# Patient Record
Sex: Female | Born: 1985
Health system: Southern US, Community
[De-identification: ages and names within clinical notes are randomized; demographics above are authoritative.]

## PROBLEM LIST (undated history)

## (undated) DIAGNOSIS — O24419 Gestational diabetes mellitus in pregnancy, unspecified control: Secondary | ICD-10-CM

## (undated) DIAGNOSIS — D219 Benign neoplasm of connective and other soft tissue, unspecified: Secondary | ICD-10-CM

## (undated) DIAGNOSIS — J45909 Unspecified asthma, uncomplicated: Secondary | ICD-10-CM

## (undated) DIAGNOSIS — I10 Essential (primary) hypertension: Secondary | ICD-10-CM

## (undated) DIAGNOSIS — R87629 Unspecified abnormal cytological findings in specimens from vagina: Secondary | ICD-10-CM

## (undated) DIAGNOSIS — G43909 Migraine, unspecified, not intractable, without status migrainosus: Secondary | ICD-10-CM

## (undated) HISTORY — DX: Benign neoplasm of connective and other soft tissue, unspecified: D21.9

## (undated) HISTORY — PX: WISDOM TOOTH EXTRACTION: SHX21

## (undated) HISTORY — PX: HERNIA REPAIR: SHX51

## (undated) HISTORY — DX: Essential (primary) hypertension: I10

## (undated) HISTORY — DX: Gestational diabetes mellitus in pregnancy, unspecified control: O24.419

## (undated) HISTORY — DX: Unspecified abnormal cytological findings in specimens from vagina: R87.629

---
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
## 2012-06-22 HISTORY — PX: TONSILLECTOMY: SUR1361

## 2017-11-24 ENCOUNTER — Telehealth: Payer: Self-pay | Admitting: General Practice

## 2017-11-24 DIAGNOSIS — Z3490 Encounter for supervision of normal pregnancy, unspecified, unspecified trimester: Secondary | ICD-10-CM

## 2017-11-24 NOTE — Telephone Encounter (Signed)
Eliezer Lofts from [facility] called and left message on our nurse voicemail line stating the patient came a couple weeks ago for a pregnancy test which was positive. Patient returned again last night for ultrasound. Ultrasound shows two gestational sacs but no yolk sac or fetal pole. Patient has not had bleeding or cramping. They are calling to refer the patient for a follow up ultrasound. Scheduled ultrasound for 6/10 @ 8am. Called & informed patient. Patient verbalized understanding & had no questions.

## 2017-11-29 ENCOUNTER — Ambulatory Visit (INDEPENDENT_AMBULATORY_CARE_PROVIDER_SITE_OTHER): Payer: Medicaid Other | Admitting: *Deleted

## 2017-11-29 ENCOUNTER — Ambulatory Visit (HOSPITAL_COMMUNITY)
Admission: RE | Admit: 2017-11-29 | Discharge: 2017-11-29 | Disposition: A | Discharge status: Home / Self Care | Payer: Medicaid Other | Source: Ambulatory Visit | Attending: Medical | Admitting: Medical

## 2017-11-29 DIAGNOSIS — Z3A01 Less than 8 weeks gestation of pregnancy: Secondary | ICD-10-CM | POA: Insufficient documentation

## 2017-11-29 DIAGNOSIS — Z712 Person consulting for explanation of examination or test findings: Secondary | ICD-10-CM

## 2017-11-29 DIAGNOSIS — Z3491 Encounter for supervision of normal pregnancy, unspecified, first trimester: Secondary | ICD-10-CM | POA: Insufficient documentation

## 2017-11-29 DIAGNOSIS — O3680X0 Pregnancy with inconclusive fetal viability, not applicable or unspecified: Secondary | ICD-10-CM

## 2017-11-29 NOTE — Progress Notes (Signed)
[place] results reviewed by Marcille Buffy. Pt and significant other informed of results, including low FHR. She will need follow up US in 10 days to check for progression of pregnancy. Pt was advised to return to hospital if she develops severe abdominal cramping or heavy vaginal bleeding. Pt voiced understanding of all information and instructions given.

## 2017-11-29 NOTE — Progress Notes (Signed)
I have reviewed the chart and agree with nursing staff's documentation of this patient's encounter.  Marcille Buffy, CNM 11/29/2017 12:00 PM

## 2017-12-06 ENCOUNTER — Encounter: Payer: Self-pay | Admitting: Certified Nurse Midwife

## 2017-12-09 ENCOUNTER — Other Ambulatory Visit (HOSPITAL_COMMUNITY)
Admission: AD | Admit: 2017-12-09 | Discharge: 2017-12-09 | Disposition: A | Discharge status: Home / Self Care | Payer: Medicaid Other | Source: Ambulatory Visit | Attending: Advanced Practice Midwife | Admitting: Advanced Practice Midwife

## 2017-12-09 ENCOUNTER — Encounter: Payer: Self-pay | Admitting: Advanced Practice Midwife

## 2017-12-09 ENCOUNTER — Ambulatory Visit (HOSPITAL_COMMUNITY)
Admission: RE | Admit: 2017-12-09 | Discharge: 2017-12-09 | Disposition: A | Discharge status: Home / Self Care | Payer: Medicaid Other | Source: Ambulatory Visit | Attending: Advanced Practice Midwife | Admitting: Advanced Practice Midwife

## 2017-12-09 ENCOUNTER — Telehealth: Payer: Self-pay | Admitting: Advanced Practice Midwife

## 2017-12-09 ENCOUNTER — Encounter: Payer: Medicaid Other | Admitting: Certified Nurse Midwife

## 2017-12-09 ENCOUNTER — Other Ambulatory Visit: Payer: Self-pay

## 2017-12-09 ENCOUNTER — Ambulatory Visit (INDEPENDENT_AMBULATORY_CARE_PROVIDER_SITE_OTHER): Payer: Medicaid Other | Admitting: Advanced Practice Midwife

## 2017-12-09 VITALS — BP 119/85 | HR 75 | Wt 193.0 lb

## 2017-12-09 DIAGNOSIS — O021 Missed abortion: Secondary | ICD-10-CM

## 2017-12-09 DIAGNOSIS — O3680X0 Pregnancy with inconclusive fetal viability, not applicable or unspecified: Secondary | ICD-10-CM | POA: Diagnosis not present

## 2017-12-09 LAB — CBC
HEMATOCRIT: 34.5 % — AB (ref 36.0–46.0)
HEMOGLOBIN: 11.1 g/dL — AB (ref 12.0–15.0)
MCH: 25.7 pg — AB (ref 26.0–34.0)
MCHC: 32.2 g/dL (ref 30.0–36.0)
MCV: 79.9 fL (ref 78.0–100.0)
Platelets: 281 10*3/uL (ref 150–400)
RBC: 4.32 MIL/uL (ref 3.87–5.11)
RDW: 16.5 % — ABNORMAL HIGH (ref 11.5–15.5)
WBC: 7.2 10*3/uL (ref 4.0–10.5)

## 2017-12-09 LAB — ABO/RH: ABO/RH(D): O POS

## 2017-12-09 MED ORDER — TRAMADOL HCL 50 MG PO TABS: 50.0000 mg | ORAL_TABLET | Freq: Four times a day (QID) | ORAL | 0 refills | Status: DC | PRN

## 2017-12-09 MED ORDER — MISOPROSTOL 200 MCG PO TABS: 200.0000 ug | ORAL_TABLET | Freq: Four times a day (QID) | ORAL | 0 refills | Status: DC

## 2017-12-09 MED ORDER — PROMETHAZINE HCL 25 MG PO TABS: 12.5000 mg | ORAL_TABLET | Freq: Four times a day (QID) | ORAL | 0 refills | Status: DC | PRN

## 2017-12-09 NOTE — Progress Notes (Signed)
S: 32 y.o. P/U/S presents to [facility] office for follow up results after scheduled outpatient ultrasound. She initial presented from [place] on 11/29/17 for follow up US with positive pregnancy test but only gestation sac x 2 seen on care center [place].  On 11/29/17 at [facility], she had irregularly shaped gestational sac x 1, yolk sac, and fetal pole with FHR 70 bpm at [redacted]w[redacted]d.  A follow up US was ordered in 10 days.  She had [place] today but had new OB scheduled at [facility] so went to her appointment instead of coming down to [facility] office for results.  She was sent back to [facility] from [place] for results.  She denies pain or bleeding today.  She did have some light bleeding 4 days ago that lasted 2-3 days but has resolved today.   HPI  Review of Systems  Constitutional: Negative for chills, fever and malaise/fatigue.  Eyes: Negative for blurred vision.  Respiratory: Negative for cough and shortness of breath.   Cardiovascular: Negative for chest pain.  Gastrointestinal: Negative for heartburn, nausea and vomiting.  Genitourinary: Negative for dysuria, frequency and urgency.  Musculoskeletal: Negative.   Neurological: Negative for dizziness and headaches.  Psychiatric/Behavioral: Negative for depression.    O: BP 119/85   Pulse 75   Wt 193 lb (87.5 kg)   LMP 09/26/2017  US Ob Transvaginal  Result Date: 12/09/2017 CLINICAL DATA:  Followup viability. Quantitative beta HCG is not available. Unknown LMP. By first ultrasound patient is 7 weeks 4 days. EDC by first ultrasound is 07/24/2018. EXAM: TRANSVAGINAL OB ULTRASOUND TECHNIQUE: Transvaginal ultrasound was performed for complete evaluation of the gestation as well as the maternal uterus, adnexal regions, and pelvic cul-de-sac. COMPARISON:  11/29/2017 FINDINGS: Intrauterine gestational sac: Single Yolk sac:  Visualized. Embryo:  Visualized. Cardiac Activity: Not Visualized. Heart Rate: Absent bpm CRL:   4.6 mm   6 w 1 d                  [place] EDC: 08/03/2018  Subchorionic hemorrhage:  None visualized. Maternal uterus/adnexae: Normal appearance of the ovaries. No free pelvic fluid. IMPRESSION: 1. Absent embryonic cardiac activity, consistent with embryonic demise. Heart rate was noted to be 70 beats per minute on 11/29/2017. 2. Lack of appropriate interval growth. Electronically Signed   By: Nolon Nations M.D.   On: 12/09/2017 15:36   --/--/O POS Performed at [facility], [address], [place], [place] [address]  (06/20 1733)   VS reviewed, nursing note reviewed,  Constitutional: well developed, well nourished, no distress HEENT: normocephalic CV: normal rate Pulm/chest wall: normal effort Abdomen: soft Neuro: alert and oriented x 3 Skin: warm, dry Psych: affect normal  MDM: Discussed [place] results today with pt, showing no growth in size of fetal pole in 10 days and no FHR today. This is definitive for missed ab.  Offered pt expectant management vs Cytotec vs D&C. Pt elects to take Cytotec. Rx written for Cytotec 600 mcg buccal dosing x 1.  Rx for Phenergan and Tramadol PRN sent to pharmacy as well. F/U at [facility] in 2 weeks.  Return to MAU with heavy bleeding/pain/emergencies.   Ordered CBC, Abo/Rh to be drawn outpatient since lab now closed in clinic. Labs drawn and will call pt with results. Pt stable at time of discharge.  A: 1. Missed abortion     P: D/C home Follow up in 2 weeks in office.   Fatima Blank, CNM 10:41 AM

## 2017-12-09 NOTE — Patient Instructions (Signed)

## 2017-12-10 ENCOUNTER — Telehealth: Payer: Self-pay | Admitting: Advanced Practice Midwife

## 2017-12-10 NOTE — Telephone Encounter (Signed)
Called pt to notify her of normal lab results yesterday.  Blood type O positive.  Pt took Cytotec last night and had heavy bleeding with clots last night and this morning. She is doing well, denies any dizziness, fatigue, ha, SOB. Bleeding is moderate now.  Plan for f/u 12/24/17 in [facility] office as scheduled. Return to MAU as needed for emergencies.

## 2017-12-13 NOTE — Telephone Encounter (Signed)
duplicate

## 2017-12-24 ENCOUNTER — Ambulatory Visit (INDEPENDENT_AMBULATORY_CARE_PROVIDER_SITE_OTHER): Payer: Medicaid Other | Admitting: Obstetrics and Gynecology

## 2017-12-24 ENCOUNTER — Encounter: Payer: Self-pay | Admitting: Obstetrics and Gynecology

## 2017-12-24 VITALS — BP 143/90 | HR 74 | Wt 189.7 lb

## 2017-12-24 DIAGNOSIS — O039 Complete or unspecified spontaneous abortion without complication: Secondary | ICD-10-CM

## 2017-12-24 NOTE — Progress Notes (Signed)
S:  Kristine Johnson is a 32 y.o. G1 here in the office for a follow up from a recent SAB with cytotec. She took cytotec 600 mg buccal on 6/20. She started having heavy bleeding a few hours after taking the cyotec. Says she knows she passed tissue. Her bleeding has stopped. She denies pain at this time.    O:  GENERAL: Well-developed, well-nourished female in no acute distress.  LUNGS: Effort normal SKIN: Warm, dry and without erythema PSYCH: Normal mood and affect  Vitals:   12/24/17 1604  Weight: 189 lb 11.2 oz (86 kg)   Blood pressure (!) 143/90, pulse 74, weight 189 lb 11.2 oz (86 kg).   A:  SAB  P:  Doing well Quant today Follow up in 2 weeks   Presley Summerlin, Artist Pais, NP 12/24/2017 4:54 PM

## 2017-12-25 LAB — BETA HCG QUANT (REF LAB): hCG Quant: 19 m[IU]/mL

## 2017-12-31 ENCOUNTER — Telehealth: Payer: Self-pay | Admitting: General Practice

## 2017-12-31 NOTE — Telephone Encounter (Signed)
Patient called and left message on nurse voicemail line requesting results. Called patient & informed her of results. Discussed a repeat level may be ordered at her next visit if the provider feels it is needed. Patient verbalized understanding & had no questions.

## 2018-01-12 ENCOUNTER — Other Ambulatory Visit: Payer: Medicaid Other

## 2018-01-12 ENCOUNTER — Ambulatory Visit: Payer: Medicaid Other | Admitting: Student

## 2018-01-12 DIAGNOSIS — O039 Complete or unspecified spontaneous abortion without complication: Secondary | ICD-10-CM

## 2018-01-12 NOTE — Addendum Note (Signed)
Addended by: Diona Foley on: 01/12/2018 04:09 PM   Modules accepted: Orders

## 2018-01-13 LAB — BETA HCG QUANT (REF LAB): hCG Quant: <1 m[IU]/mL

## 2018-06-25 ENCOUNTER — Ambulatory Visit: Payer: Medicaid Other | Admitting: Nurse Practitioner

## 2018-06-25 ENCOUNTER — Encounter: Payer: Self-pay | Admitting: Nurse Practitioner

## 2018-06-25 VITALS — BP 130/80 | HR 83 | Temp 98.8°F | Resp 14 | Wt 201.6 lb

## 2018-06-25 DIAGNOSIS — J309 Allergic rhinitis, unspecified: Secondary | ICD-10-CM

## 2018-06-25 MED ORDER — FLUTICASONE PROPIONATE 50 MCG/ACT NA SUSP: 2.0000 | Freq: Every day | NASAL | 0 refills | Status: DC

## 2018-06-25 MED ORDER — MONTELUKAST SODIUM 10 MG PO TABS: 10.0000 mg | ORAL_TABLET | Freq: Every day | ORAL | 0 refills | Status: DC

## 2018-06-25 MED ORDER — BENZONATATE 200 MG PO CAPS: 200.0000 mg | ORAL_CAPSULE | Freq: Three times a day (TID) | ORAL | 0 refills | Status: AC | PRN

## 2018-06-25 NOTE — Progress Notes (Signed)
Subjective:     Kristine Johnson is a 33 y.o. female who presents for evaluation of sore throat. Associated symptoms include left ear pressure, nasal blockage, post nasal drip, sinus and nasal congestion and sore throat. Onset of symptoms was several days ago with coughing and nasal congestion, but sore throat and ear pain started one day ago. and has been unchanged since that time. She is drinking plenty of fluids. She has not had a recent close exposure to someone with proven streptococcal pharyngitis.    "Patient also complains of a cough that she has had for several days now.  Patient states the cough is productive of yellow sputum.  Patient has been drinking tea and honey for the cough, and has taken ibuprofen for her ear pain.  Patient rates current throat pain 4/10 at present and her ear pain she states is controlled but can get as high as 10/10.  Patient states she does have a history of seasonal allergies.  The following portions of the patient's history were reviewed and updated as appropriate: allergies, current medications and past medical history.  Review of Systems Constitutional: negative Eyes: negative Ears, nose, mouth, throat, and face: positive for earaches, nasal congestion and sore throat, negative for ear drainage and hoarseness Respiratory: positive for cough and sputum, negative for asthma, chronic bronchitis, dyspnea on exertion, stridor and wheezing Cardiovascular: negative Gastrointestinal: negative Neurological: positive for headaches, negative for coordination problems, dizziness, paresthesia, vertigo and weakness    Objective:    BP 130/80   Pulse 83   Temp 98.8 F (37.1 C)   Resp 14   Wt 201 lb 9.6 oz (91.4 kg)   SpO2 99%  General appearance: alert, cooperative and no distress Head: Normocephalic, without obvious abnormality, atraumatic Eyes: conjunctivae/corneas clear. PERRL, EOM's intact. Fundi benign. Ears: normal TM's and external ear canals both  ears Nose: no discharge, mild congestion, turbinates swollen, inflamed, no sinus tenderness Throat: abnormal findings: mild oropharyngeal erythema Lungs: clear to auscultation bilaterally Heart: regular rate and rhythm, S1, S2 normal, no murmur, click, rub or gallop Abdomen: soft, non-tender; bowel sounds normal; no masses,  no organomegaly Pulses: 2+ and symmetric Skin: Skin color, texture, turgor normal. No rashes or lesions Lymph nodes: cervical and submandibular nodes normal Neurologic: Grossly normal, no cranial nerve deficits  Laboratory Strep test not done. Results:n/a.    Assessment:   Allergic Rhinitis Plan:   Exam findings, diagnosis etiology and medication use and indications reviewed with patient. Follow- Up and discharge instructions provided. No emergent/urgent issues found on exam.  Some the patient's clinical presentation, symptoms, physical assessment, I do not feel this is an infectious process at this time.  No antibiotics are warranted at this time however will provide symptomatic treatment for the patient.  Informed patient that if symptoms do not improve within the next 10 to 14 days to return to our office, or follow-up sooner if there are any changes or worsening of current symptoms.  Patient education was provided. Patient verbalized understanding of information provided and agrees with plan of care (POC), all questions answered. The patient is advised to call or return to clinic if condition does not see an improvement in symptoms, or to seek the care of the closest emergency department if condition worsens with the above plan.   1. Allergic rhinitis, unspecified seasonality, unspecified trigger  - montelukast (SINGULAIR) 10 MG tablet; Take 1 tablet (10 mg total) by mouth at bedtime.  Dispense: 30 tablet; Refill: 0 - fluticasone (FLONASE) 50  MCG/ACT nasal spray; Place 2 sprays into both nostrils daily for 10 days.  Dispense: 16 g; Refill: 0 - benzonatate (TESSALON)  200 MG capsule; Take 1 capsule (200 mg total) by mouth 3 (three) times daily as needed for up to 10 days for cough.  Dispense: 30 capsule; Refill: 0  -Take medication as prescribed. -Ibuprofen or Tylenol for pain, fever, or general discomfort. -Increase fluids. -Sleep elevated on at least 2 pillows at bedtime to help with cough. -Use a humidifier or vaporizer when at home and during sleep to help with cough. -May use a teaspoon of honey or over-the-counter cough drops to help with cough. -Follow-up in 10-14 days if symptoms do not improve.

## 2018-06-25 NOTE — Patient Instructions (Signed)
Allergic Rhinitis, Adult -Take medication as prescribed. -Ibuprofen or Tylenol for pain, fever, or general discomfort. -Increase fluids. -Sleep elevated on at least 2 pillows at bedtime to help with cough. -Use a humidifier or vaporizer when at home and during sleep to help with cough. -May use a teaspoon of honey or over-the-counter cough drops to help with cough. -Follow-up in 10-14 days if symptoms do not improve.  Allergic rhinitis is an allergic reaction that affects the mucous membrane inside the nose. It causes sneezing, a runny or stuffy nose, and the feeling of mucus going down the back of the throat (postnasal drip). Allergic rhinitis can be mild to severe. There are two types of allergic rhinitis:  Seasonal. This type is also called hay fever. It happens only during certain seasons.  Perennial. This type can happen at any time of the year. What are the causes? This condition happens when the body's defense system (immune system) responds to certain harmless substances called allergens as though they were germs.  Seasonal allergic rhinitis is triggered by pollen, which can come from grasses, trees, and weeds. Perennial allergic rhinitis may be caused by:  House dust mites.  Pet dander.  Mold spores. What are the signs or symptoms? Symptoms of this condition include:  Sneezing.  Runny or stuffy nose (nasal congestion).  Postnasal drip.  Itchy nose.  Tearing of the eyes.  Trouble sleeping.  Daytime sleepiness. How is this diagnosed? This condition may be diagnosed based on:  Your medical history.  A physical exam.  Tests to check for related conditions, such as: ? Asthma. ? Pink eye. ? Ear infection. ? Upper respiratory infection.  Tests to find out which allergens trigger your symptoms. These may include skin or blood tests. How is this treated? There is no cure for this condition, but treatment can help control symptoms. Treatment may  include:  Taking medicines that block allergy symptoms, such as antihistamines. Medicine may be given as a shot, nasal spray, or pill.  Avoiding the allergen.  Desensitization. This treatment involves getting ongoing shots until your body becomes less sensitive to the allergen. This treatment may be done if other treatments do not help.  If taking medicine and avoiding the allergen does not work, new, stronger medicines may be prescribed. Follow these instructions at home:  Find out what you are allergic to. Common allergens include smoke, dust, and pollen.  Avoid the things you are allergic to. These are some things you can do to help avoid allergens: ? Replace carpet with wood, tile, or vinyl flooring. Carpet can trap dander and dust. ? Do not smoke. Do not allow smoking in your home. ? Change your heating and air conditioning filter at least once a month. ? During allergy season:  Keep windows closed as much as possible.  Plan outdoor activities when pollen counts are lowest. This is usually during the evening hours.  When coming indoors, change clothing and shower before sitting on furniture or bedding.  Take over-the-counter and prescription medicines only as told by your health care provider.  Keep all follow-up visits as told by your health care provider. This is important. Contact a health care provider if:  You have a fever.  You develop a persistent cough.  You make whistling sounds when you breathe (you wheeze).  Your symptoms interfere with your normal daily activities. Get help right away if:  You have shortness of breath. Summary  This condition can be managed by taking medicines as directed and  avoiding allergens.  Contact your health care provider if you develop a persistent cough or fever.  During allergy season, keep windows closed as much as possible. This information is not intended to replace advice given to you by your health care provider. Make  sure you discuss any questions you have with your health care provider. Document Released: 03/03/2001 Document Revised: 07/16/2016 Document Reviewed: 07/16/2016 Elsevier Interactive Patient Education  2019 [employer].

## 2018-08-05 ENCOUNTER — Ambulatory Visit: Payer: Medicaid Other | Admitting: Nurse Practitioner

## 2018-08-05 VITALS — BP 110/75 | HR 84 | Temp 98.6°F | Resp 18 | Wt 206.8 lb

## 2018-08-05 DIAGNOSIS — J069 Acute upper respiratory infection, unspecified: Secondary | ICD-10-CM

## 2018-08-05 DIAGNOSIS — R6889 Other general symptoms and signs: Secondary | ICD-10-CM

## 2018-08-05 LAB — POCT INFLUENZA A/B
Influenza A, POC: NEGATIVE
Influenza B, POC: NEGATIVE

## 2018-08-05 MED ORDER — PSEUDOEPH-BROMPHEN-DM 30-2-10 MG/5ML PO SYRP: 5.0000 mL | ORAL_SOLUTION | Freq: Four times a day (QID) | ORAL | 0 refills | Status: AC | PRN

## 2018-08-05 MED ORDER — FLUTICASONE PROPIONATE 50 MCG/ACT NA SUSP: 2.0000 | Freq: Every day | NASAL | 0 refills | Status: DC

## 2018-08-05 MED ORDER — LIDOCAINE VISCOUS HCL 2 % MT SOLN: 5.0000 mL | OROMUCOSAL | 0 refills | Status: AC | PRN

## 2018-08-05 NOTE — Patient Instructions (Signed)
Upper Respiratory Infection, Adult -Take medication as prescribed. -Ibuprofen 800mg  every 8 hours for the next 48 hours.  This will help with throat pain/inflammation, generalized bodyaches and discomfort. -Get plenty of rest. -Increase fluids. -Sleep elevated on at least 2 pillows at bedtime to help with cough. -Use a humidifier or vaporizer when at home and during sleep to help with cough. -May use a teaspoon of honey or over-the-counter cough drops to help with cough and sore throat. -Follow-up if symptoms do not improve within the next 5-7 days or worsen before that time.  An upper respiratory infection (URI) is a common viral infection of the nose, throat, and upper air passages that lead to the lungs. The most common type of URI is the common cold. URIs usually get better on their own, without medical treatment. What are the causes? A URI is caused by a virus. You may catch a virus by:  Breathing in droplets from an infected person's cough or sneeze.  Touching something that has been exposed to the virus (contaminated) and then touching your mouth, nose, or eyes. What increases the risk? You are more likely to get a URI if:  You are very young or very old.  It is autumn or winter.  You have close contact with others, such as at a daycare, school, or health care facility.  You smoke.  You have long-term (chronic) heart or lung disease.  You have a weakened disease-fighting (immune) system.  You have nasal allergies or asthma.  You are experiencing a lot of stress.  You work in an area that has poor air circulation.  You have poor nutrition. What are the signs or symptoms? A URI usually involves some of the following symptoms:  Runny or stuffy (congested) nose.  Sneezing.  Cough.  Sore throat.  Headache.  Fatigue.  Fever.  Loss of appetite.  Pain in your forehead, behind your eyes, and over your cheekbones (sinus pain).  Muscle aches.  Redness or  irritation of the eyes.  Pressure in the ears or face. How is this diagnosed? This condition may be diagnosed based on your medical history and symptoms, and a physical exam. Your health care provider may use a cotton swab to take a mucus sample from your nose (nasal swab). This sample can be tested to determine what virus is causing the illness. How is this treated? URIs usually get better on their own within 7-10 days. You can take steps at home to relieve your symptoms. Medicines cannot cure URIs, but your health care provider may recommend certain medicines to help relieve symptoms, such as:  Over-the-counter cold medicines.  Cough suppressants. Coughing is a type of defense against infection that helps to clear the respiratory system, so take these medicines only as recommended by your health care provider.  Fever-reducing medicines. Follow these instructions at home: Activity  Rest as needed.  If you have a fever, stay home from work or school until your fever is gone or until your health care provider says you are no longer contagious. Your health care provider may have you wear a face mask to prevent your infection from spreading. Relieving symptoms  Gargle with a salt-water mixture 3-4 times a day or as needed. To make a salt-water mixture, completely dissolve -1 tsp of salt in 1 cup of warm water.  Use a cool-mist humidifier to add moisture to the air. This can help you breathe more easily. Eating and drinking   Drink enough fluid to keep  your urine pale yellow.  Eat soups and other clear broths. General instructions   Take over-the-counter and prescription medicines only as told by your health care provider. These include cold medicines, fever reducers, and cough suppressants.  Do not use any products that contain nicotine or tobacco, such as cigarettes and e-cigarettes. If you need help quitting, ask your health care provider.  Stay away from secondhand  smoke.  Stay up to date on all immunizations, including the yearly (annual) flu vaccine.  Keep all follow-up visits as told by your health care provider. This is important. How to prevent the spread of infection to others   URIs can be passed from person to person (are contagious). To prevent the infection from spreading: ? Wash your hands often with soap and water. If soap and water are not available, use hand sanitizer. ? Avoid touching your mouth, face, eyes, or nose. ? Cough or sneeze into a tissue or your sleeve or elbow instead of into your hand or into the air. Contact a health care provider if:  You are getting worse instead of better.  You have a fever or chills.  Your mucus is brown or red.  You have yellow or brown discharge coming from your nose.  You have pain in your face, especially when you bend forward.  You have swollen neck glands.  You have pain while swallowing.  You have white areas in the back of your throat. Get help right away if:  You have shortness of breath that gets worse.  You have severe or persistent: ? Headache. ? Ear pain. ? Sinus pain. ? Chest pain.  You have chronic lung disease along with any of the following: ? Wheezing. ? Prolonged cough. ? Coughing up blood. ? A change in your usual mucus.  You have a stiff neck.  You have changes in your: ? Vision. ? Hearing. ? Thinking. ? Mood. Summary  An upper respiratory infection (URI) is a common infection of the nose, throat, and upper air passages that lead to the lungs.  A URI is caused by a virus.  URIs usually get better on their own within 7-10 days.  Medicines cannot cure URIs, but your health care provider may recommend certain medicines to help relieve symptoms. This information is not intended to replace advice given to you by your health care provider. Make sure you discuss any questions you have with your health care provider. Document Released: 12/02/2000  Document Revised: 01/22/2017 Document Reviewed: 01/22/2017 Elsevier Interactive Patient Education  2019 [employer].

## 2018-08-05 NOTE — Progress Notes (Signed)
Subjective:    Patient ID: Kristine Johnson, female    DOB: 06-03-1986, 33 y.o.   MRN: [id number]  Patient is a 33 year old female who presents today for complaints of sore throat, headache, chest congestion, body aches, and left ear pain.  Patient also complains of chills that she had at work last night.  Patient denies fever, sinus pain or pressure, ear drainage, productive cough, abdominal pain, nausea, or vomiting.  Patient states that she took 1 Advil Cold and Sinus with no relief.  Patient also informs that she did not have an influenza vaccine this flu season.  Sore Throat   This is a new problem. The current episode started in the past 7 days. The problem has been unchanged. Neither side of throat is experiencing more pain than the other. There has been no fever. The pain is at a severity of 6/10. Associated symptoms include congestion, coughing, ear pain, headaches and trouble swallowing. Pertinent negatives include no abdominal pain, diarrhea, ear discharge, shortness of breath, stridor or vomiting. She has had no exposure to strep. Treatments tried: Took 1 Advil Cold and Sinus. The treatment provided no relief.  Headache   The current episode started in the past 7 days. The problem occurs intermittently. The problem has been gradually improving. The pain is located in the frontal region. The pain does not radiate. The pain quality is similar to prior headaches. The quality of the pain is described as aching. The pain is mild. Associated symptoms include coughing, ear pain, rhinorrhea and a sore throat. Pertinent negatives include no abdominal pain, dizziness, fever, nausea, numbness, sinus pressure, vomiting or weakness. The symptoms are aggravated by coughing. Treatments tried: Advil Cold and Sinus. The treatment provided no relief. There is no history of migraine headaches, recent head traumas or sinus disease.   No past medical history on file.   Review of Systems  Constitutional:  Positive for appetite change, chills and fatigue. Negative for fever.  HENT: Positive for congestion, ear pain, postnasal drip, rhinorrhea, sore throat and trouble swallowing. Negative for ear discharge, sinus pressure, sinus pain and voice change.   Eyes: Negative.   Respiratory: Positive for cough. Negative for chest tightness, shortness of breath, wheezing and stridor.   Cardiovascular: Negative.   Gastrointestinal: Negative for abdominal pain, diarrhea, nausea and vomiting.       Decreased appetite  Skin: Negative.   Neurological: Positive for headaches. Negative for dizziness, facial asymmetry, speech difficulty, weakness, light-headedness and numbness.      Objective: Blood pressure 110/75, pulse 84, temperature 98.6 F (37 C), temperature source Oral, resp. rate 18, weight 206 lb 12.8 oz (93.8 kg), SpO2 98 %.   Physical Exam Vitals signs reviewed.  Constitutional:      General: She is not in acute distress. HENT:     Head: Normocephalic.     Right Ear: Tympanic membrane and ear canal normal. No middle ear effusion. Tympanic membrane is not erythematous.     Left Ear: Tympanic membrane and ear canal normal.  No middle ear effusion. Tympanic membrane is not erythematous.     Nose: Congestion (moderate) and rhinorrhea present.     Mouth/Throat:     Mouth: Mucous membranes are moist.     Pharynx: Pharyngeal swelling, posterior oropharyngeal erythema and uvula swelling present. No oropharyngeal exudate.     Tonsils: No tonsillar exudate. Swelling: 1+ on the right. 1+ on the left.  Eyes:     Conjunctiva/sclera: Conjunctivae normal.     Pupils: Pupils  are equal, round, and reactive to light.  Neck:     Musculoskeletal: Normal range of motion and neck supple.     Thyroid: No thyromegaly.  Cardiovascular:     Rate and Rhythm: Normal rate and regular rhythm.     Heart sounds: Normal heart sounds.  Pulmonary:     Effort: Pulmonary effort is normal. No respiratory distress.      Breath sounds: Normal breath sounds. No stridor. No wheezing, rhonchi or rales.  Abdominal:     General: Bowel sounds are normal.     Palpations: Abdomen is soft.     Tenderness: There is no abdominal tenderness.  Lymphadenopathy:     Cervical: Cervical adenopathy (bilateral superficial) present.  Skin:    General: Skin is warm and dry.     Capillary Refill: Capillary refill takes less than 2 seconds.  Neurological:     General: No focal deficit present.     Mental Status: She is alert and oriented to person, place, and time.  Psychiatric:        Mood and Affect: Mood normal.        Behavior: Behavior normal.    Results for orders placed or performed in visit on 08/05/18 (from the past 24 hour(s))  POCT Influenza A/B     Status: Normal   Collection Time: 08/05/18 10:33 AM  Result Value Ref Range   Influenza A, POC Negative Negative   Influenza B, POC Negative Negative       Assessment & Plan:   Exam findings, diagnosis etiology and medication use and indications reviewed with patient. Follow- Up and discharge instructions provided. No emergent/urgent issues found on exam.  Based on the patient's clinical presentation, symptoms, physical exam, and influenza test result, patient symptoms are of viral etiology.  Patient does not have influenza A or B, thus, I feel her symptoms are related to an upper respiratory infection.  Patient also has not had high fever, and appears overall in no acute distress.  Patient does not have purulent nasal drainage, double sickening or a high fever to indicate a bacterial infection.  Patient's vital signs are stable in our office.  Will provide the patient with symptomatic treatment to include Bromfed for cough, fluticasone for nasal congestion, and lidocaine for her throat pain.  Instructed the patient that if her symptoms do not improve within the next 5 to 7 days, I would like her to follow-up in our office as she may need reevaluation for consideration  of antibiotics at that time.  Patient education was provided. Patient verbalized understanding of information provided and agrees with plan of care (POC), all questions answered. The patient is advised to call or return to clinic if condition does not see an improvement in symptoms, or to seek the care of the closest emergency department if condition worsens with the above plan.   1. Flu-like symptoms  - POCT Influenza A/B  2. Viral upper respiratory infection  - brompheniramine-pseudoephedrine-DM 30-2-10 MG/5ML syrup; Take 5 mLs by mouth 4 (four) times daily as needed for up to 7 days.  Dispense: 150 mL; Refill: 0 - fluticasone (FLONASE) 50 MCG/ACT nasal spray; Place 2 sprays into both nostrils daily for 10 days.  Dispense: 16 g; Refill: 0 - lidocaine (XYLOCAINE) 2 % solution; Use as directed 5 mLs in the mouth or throat as needed for up to 5 days for mouth pain. Gargle and swallow up to 4 times daily as needed.  Dispense: 100 mL; Refill: 0 -  Take medication as prescribed. -Ibuprofen 800mg  every 8 hours for the next 48 hours.  This will help with throat pain/inflammation, generalized bodyaches and discomfort. -Get plenty of rest. -Increase fluids. -Sleep elevated on at least 2 pillows at bedtime to help with cough. -Use a humidifier or vaporizer when at home and during sleep to help with cough. -May use a teaspoon of honey or over-the-counter cough drops to help with cough and sore throat. -Follow-up if symptoms do not improve within the next 5-7 days or worsen before that time.

## 2018-09-21 ENCOUNTER — Ambulatory Visit: Payer: Medicaid Other | Admitting: Physician Assistant

## 2018-09-21 ENCOUNTER — Other Ambulatory Visit: Payer: Self-pay

## 2018-09-21 VITALS — BP 110/78 | HR 77 | Temp 98.4°F | Resp 16 | Wt 204.4 lb

## 2018-09-21 DIAGNOSIS — R3915 Urgency of urination: Secondary | ICD-10-CM

## 2018-09-21 DIAGNOSIS — R82998 Other abnormal findings in urine: Secondary | ICD-10-CM

## 2018-09-21 LAB — POCT URINALYSIS DIPSTICK
Bilirubin, UA: NEGATIVE
Glucose, UA: NEGATIVE
Ketones, UA: NEGATIVE
Nitrite, UA: NEGATIVE
Protein, UA: NEGATIVE
Spec Grav, UA: 1.02 (ref 1.010–1.025)
Urobilinogen, UA: 0.2 E.U./dL
pH, UA: 7 (ref 5.0–8.0)

## 2018-09-21 LAB — POCT URINE PREGNANCY: Preg Test, Ur: NEGATIVE

## 2018-09-21 MED ORDER — NITROFURANTOIN MONOHYD MACRO 100 MG PO CAPS: 100.0000 mg | ORAL_CAPSULE | Freq: Two times a day (BID) | ORAL | 0 refills | Status: AC

## 2018-09-21 NOTE — Progress Notes (Signed)
09/21/2018 at 11:10 AM  Kristine Johnson / DOB: 09-07-1985 / MRN: [id number]  The patient does not have a problem list on file.  SUBJECTIVE  Kristine Johnson is a 33 y.o. female who complains of urinary frequency, urinary urgency, cloudy malordorous urine and suprapubic pressure x 5 days. Has some low back pain but that is normal for her has she does a lot of lifting and has issues with sciatica.  She denies dysuria, hematuria, flank pain, pelvic pain, genital rash, genital irritation and vaginal discharge. Has not tried anything for relief. Most recent UTI prior to this was years ago. Sexually active with monogamous partner. No concern for STD or pregnancy. LMP 09/10/18. Denies smoking. No PMH of HTN, DM, renal disease, or heart disease.  She  has no past medical history on file.    Medications reviewed and updated by myself where necessary, and exist elsewhere in the encounter.   Ms. Divito has No Known Allergies. She  reports that she has never smoked. She has never used smokeless tobacco. She  has no history on file for sexual activity. The patient  has no past surgical history on file.  Her family history is not on file.  Review of Systems  Constitutional: Negative for diaphoresis.  Gastrointestinal: Negative for nausea and vomiting.  Musculoskeletal: Negative for myalgias.  Skin: Negative for rash.    OBJECTIVE  Her  weight is 204 lb 6.4 oz (92.7 kg). Her oral temperature is 98.4 F (36.9 C). Her blood pressure is 110/78 and her pulse is 77. Her respiration is 16 and oxygen saturation is 99%.  The patient's body mass index is unknown because there is no height or weight on file.  Physical Exam Vitals signs reviewed.  Constitutional:      General: She is not in acute distress.    Appearance: Normal appearance. She is well-developed. She is not ill-appearing or toxic-appearing.  HENT:     Head: Normocephalic and atraumatic.  Eyes:     Conjunctiva/sclera: Conjunctivae normal.   Neck:     Musculoskeletal: Normal range of motion.  Pulmonary:     Effort: Pulmonary effort is normal.  Abdominal:     Palpations: Abdomen is soft.     Tenderness: There is no abdominal tenderness. There is no right CVA tenderness or left CVA tenderness.  Musculoskeletal:     Thoracic back: Normal. She exhibits no tenderness and no bony tenderness.     Lumbar back: Normal. She exhibits no tenderness and no bony tenderness.  Skin:    General: Skin is warm and dry.  Neurological:     Mental Status: She is alert and oriented to person, place, and time.     Results for orders placed or performed in visit on 09/21/18 (from the past 24 hour(s))  POCT urinalysis dipstick     Status: Abnormal   Collection Time: 09/21/18 10:46 AM  Result Value Ref Range   Color, UA yellow    Clarity, UA clear    Glucose, UA Negative Negative   Bilirubin, UA negative    Ketones, UA negative    Spec Grav, UA 1.020 1.010 - 1.025   Blood, UA trace    pH, UA 7.0 5.0 - 8.0   Protein, UA Negative Negative   Urobilinogen, UA 0.2 0.2 or 1.0 E.U./dL   Nitrite, UA negative    Leukocytes, UA Trace (A) Negative   Appearance     Odor    POCT urine pregnancy     Status:  Normal   Collection Time: 09/21/18 10:48 AM  Result Value Ref Range   Preg Test, Ur Negative Negative    ASSESSMENT & PLAN  Kristine was seen today for dysuria and back pain.  Diagnoses and all orders for this visit:  Urinary urgency -     POCT urinalysis dipstick -     POCT urine pregnancy  Leukocytes in urine -     nitrofurantoin, macrocrystal-monohydrate, (MACROBID) 100 MG capsule; Take 1 capsule (100 mg total) by mouth 2 (two) times daily for 5 days.    Hx and UA uggestive of UTI. UA dip w/ +leukocytes and trace blood. Will treat empirically at this time w/ oral abx. No concern for pyleonephritis-she is afebrile, no nausea, vomiting, or flank pain, no CVA tenderness noted on exam. Informed patient that we do not obtain urine  cultures in our office. If no improvement in 24-48 hrs will need to f/u with family doctor or urgent care for further evaluation and management. Advised to seek care sooner at ED if any of her current symptoms worsen or she develops new concerning symptoms with current treatment plan. Pt voices understanding.    Tenna Delaine, PA-C  [facility] 09/21/2018 11:10 AM

## 2018-09-21 NOTE — Patient Instructions (Addendum)
Urinary Tract Infection, Adult   Your results indicate you have a UTI. I have given you a prescription for an antibiotic. Please take with food. We do not obtain urine cultures at our office; therefore, if your symptoms worsen over the next 24-48 hours or you develop fever, chills, flank pain, nausea and vomiting, please seek care immediately.     A urinary tract infection (UTI) is an infection of any part of the urinary tract. The urinary tract includes:  The kidneys.  The ureters.  The bladder.  The urethra. These organs make, store, and get rid of pee (urine) in the body. What are the causes? This is caused by germs (bacteria) in your genital area. These germs grow and cause swelling (inflammation) of your urinary tract. What increases the risk? You are more likely to develop this condition if:  You have a small, thin tube (catheter) to drain pee.  You cannot control when you pee or poop (incontinence).  You are female, and: ? You use these methods to prevent pregnancy: ? A medicine that kills sperm (spermicide). ? A device that blocks sperm (diaphragm). ? You have low levels of a female hormone (estrogen). ? You are pregnant.  You have genes that add to your risk.  You are sexually active.  You take antibiotic medicines.  You have trouble peeing because of: ? A prostate that is bigger than normal, if you are female. ? A blockage in the part of your body that drains pee from the bladder (urethra). ? A kidney stone. ? A nerve condition that affects your bladder (neurogenic bladder). ? Not getting enough to drink. ? Not peeing often enough.  You have other conditions, such as: ? Diabetes. ? A weak disease-fighting system (immune system). ? Sickle cell disease. ? Gout. ? Injury of the spine. What are the signs or symptoms? Symptoms of this condition include:  Needing to pee right away (urgently).  Peeing often.  Peeing small amounts often.  Pain or burning  when peeing.  Blood in the pee.  Pee that smells bad or not like normal.  Trouble peeing.  Pee that is cloudy.  Fluid coming from the vagina, if you are female.  Pain in the belly or lower back. Other symptoms include:  Throwing up (vomiting).  No urge to eat.  Feeling mixed up (confused).  Being tired and grouchy (irritable).  A fever.  Watery poop (diarrhea). How is this treated? This condition may be treated with:  Antibiotic medicine.  Other medicines.  Drinking enough water. Follow these instructions at home:  Medicines  Take over-the-counter and prescription medicines only as told by your doctor.  If you were prescribed an antibiotic medicine, take it as told by your doctor. Do not stop taking it even if you start to feel better. General instructions  Make sure you: ? Pee until your bladder is empty. ? Do not hold pee for a long time. ? Empty your bladder after sex. ? Wipe from front to back after pooping if you are a female. Use each tissue one time when you wipe.  Drink enough fluid to keep your pee pale yellow.  Keep all follow-up visits as told by your doctor. This is important. Contact a doctor if:  You do not get better after 1-2 days.  Your symptoms go away and then come back. Get help right away if:  You have very bad back pain.  You have very bad pain in your lower belly.  You  have a fever.  You are sick to your stomach (nauseous).  You are throwing up. Summary  A urinary tract infection (UTI) is an infection of any part of the urinary tract.  This condition is caused by germs in your genital area.  There are many risk factors for a UTI. These include having a small, thin tube to drain pee and not being able to control when you pee or poop.  Treatment includes antibiotic medicines for germs.  Drink enough fluid to keep your pee pale yellow. This information is not intended to replace advice given to you by your health care  provider. Make sure you discuss any questions you have with your health care provider. Document Released: 11/25/2007 Document Revised: 12/16/2017 Document Reviewed: 12/16/2017 Elsevier Interactive Patient Education  2019 [employer].

## 2019-03-22 ENCOUNTER — Telehealth: Payer: Self-pay | Admitting: Obstetrics and Gynecology

## 2019-03-22 NOTE — Telephone Encounter (Signed)
Attempted to call patient about her appointment on 10/1 @ 10:35. No answer left voicemail instructing patient to wear a face mask for the entire appointment and no visitors are allowed during the visit. Patient instructed not to attend the appointment if she was any symptoms. Symptom list and office number left.

## 2019-03-23 ENCOUNTER — Other Ambulatory Visit: Payer: Self-pay

## 2019-03-23 ENCOUNTER — Ambulatory Visit: Payer: Medicaid Other | Admitting: Obstetrics and Gynecology

## 2019-03-23 ENCOUNTER — Encounter: Payer: Self-pay | Admitting: Obstetrics and Gynecology

## 2019-03-23 ENCOUNTER — Other Ambulatory Visit (HOSPITAL_COMMUNITY)
Admission: RE | Admit: 2019-03-23 | Discharge: 2019-03-23 | Disposition: A | Discharge status: Home / Self Care | Payer: Medicaid Other | Source: Ambulatory Visit | Attending: Obstetrics and Gynecology | Admitting: Obstetrics and Gynecology

## 2019-03-23 VITALS — BP 131/87 | HR 73 | Wt 209.0 lb

## 2019-03-23 DIAGNOSIS — Z01419 Encounter for gynecological examination (general) (routine) without abnormal findings: Secondary | ICD-10-CM

## 2019-03-23 DIAGNOSIS — Z23 Encounter for immunization: Secondary | ICD-10-CM

## 2019-03-23 DIAGNOSIS — Z0000 Encounter for general adult medical examination without abnormal findings: Secondary | ICD-10-CM | POA: Diagnosis not present

## 2019-03-23 DIAGNOSIS — N946 Dysmenorrhea, unspecified: Secondary | ICD-10-CM

## 2019-03-23 MED ORDER — ONDANSETRON 8 MG PO TBDP: 8.0000 mg | ORAL_TABLET | Freq: Three times a day (TID) | ORAL | 1 refills | Status: DC | PRN

## 2019-03-23 MED ORDER — TRAMADOL HCL 50 MG PO TABS: 50.0000 mg | ORAL_TABLET | Freq: Four times a day (QID) | ORAL | 0 refills | Status: DC | PRN

## 2019-03-23 MED ORDER — IBUPROFEN 800 MG PO TABS: 800.0000 mg | ORAL_TABLET | Freq: Four times a day (QID) | ORAL | 2 refills | Status: DC | PRN

## 2019-03-23 NOTE — Progress Notes (Signed)
Questions about cycles. Needs pap. Breast exam. STD Checks.

## 2019-03-23 NOTE — Progress Notes (Signed)
GYNECOLOGY ANNUAL PREVENTATIVE CARE ENCOUNTER NOTE  History:     Kristine Johnson is a 33 y.o. No obstetric history on file. female here for a routine annual gynecologic exam.  Current complaints: new onset painful menstrual cycles. Cycles are heavier and more painful since an SAB she had.    Denies abnormal vaginal bleeding, discharge, pelvic pain, problems with intercourse or other gynecologic concerns.    Gynecologic History No LMP recorded. Contraception: none- desires pregnancy  Last Pap: 2019. Results were: normal with negative HPV Last mammogram: NA  Obstetric History OB History  No obstetric history on file.    No past medical history on file.    Current Outpatient Medications on File Prior to Visit  Medication Sig Dispense Refill  . Multiple Vitamin (MULTIVITAMIN) tablet Take 1 tablet by mouth daily.    Marland Kitchen acetaminophen (TYLENOL) 500 MG tablet Take 500 mg by mouth every 6 (six) hours as needed.    . fluticasone (FLONASE) 50 MCG/ACT nasal spray Place 2 sprays into both nostrils daily for 10 days. 16 g 0  . fluticasone (FLONASE) 50 MCG/ACT nasal spray Place 2 sprays into both nostrils daily for 10 days. 16 g 0  . montelukast (SINGULAIR) 10 MG tablet Take 1 tablet (10 mg total) by mouth at bedtime. 30 tablet 0  . Prenatal Vit-Fe Fumarate-FA (PRENATAL MULTIVITAMIN) TABS tablet Take 1 tablet by mouth daily at 12 noon.     No current facility-administered medications on file prior to visit.     No Known Allergies  Social History:  reports that she has never smoked. She has never used smokeless tobacco.  No family history on file.  The following portions of the patient's history were reviewed and updated as appropriate: allergies, current medications, past family history, past medical history, past social history, past surgical history and problem list.  Review of Systems Pertinent items noted in HPI and remainder of comprehensive ROS otherwise negative.   Physical Exam:  BP 131/87   Pulse 73   Wt 209 lb (94.8 kg)  CONSTITUTIONAL: Well-developed, well-nourished female in no acute distress.  HENT:  Normocephalic, atraumatic, External right and left ear normal. Oropharynx is clear and moist EYES: Conjunctivae and EOM are normal. Pupils are equal, round, and reactive to light. No scleral icterus.  NECK: Normal range of motion, supple, no masses.  Normal thyroid.  SKIN: Skin is warm and dry. No rash noted. Not diaphoretic. No erythema. No pallor. MUSCULOSKELETAL: Normal range of motion. No tenderness.  No cyanosis, clubbing, or edema.  2+ distal pulses. NEUROLOGIC: Alert and oriented to person, place, and time. Normal reflexes, muscle tone coordination. No cranial nerve deficit noted. PSYCHIATRIC: Normal mood and affect. Normal behavior. Normal judgment and thought content. CARDIOVASCULAR: Normal heart rate noted, regular rhythm RESPIRATORY: Clear to auscultation bilaterally. Effort and breath sounds normal, no problems with respiration noted. BREASTS: Symmetric in size. No masses, skin changes, nipple drainage, or lymphadenopathy. ABDOMEN: Soft, normal bowel sounds, no distention noted.  No tenderness, rebound or guarding.  PELVIC: Deferred, Swabs collected without speculum. Bimanual exam. Uterus slightly enlarged, no masses, no CMT.   Assessment and Plan:   1. Painful menstrual periods  - HIV antibody (with reflex) - Tdap vaccine greater than or equal to 7yo IM - RPR - CBC - ibuprofen (ADVIL) 800 MG tablet; Take 1 tablet (800 mg total) by mouth every 6 (six) hours as needed.  Dispense: 30 tablet; Refill: 2 - ondansetron (ZOFRAN ODT) 8 MG disintegrating tablet;  Take 1 tablet (8 mg total) by mouth every 8 (eight) hours as needed for nausea or vomiting.  Dispense: 20 tablet; Refill: 1 - traMADol (ULTRAM) 50 MG tablet; Take 1-2 tablets (50-100 mg total) by mouth every 6 (six) hours as needed for severe pain.  Dispense: 30 tablet; Refill: 0 - US  Pelvis Complete; Future  2. Women's annual routine gynecological examination  - HIV antibody (with reflex) - Tdap vaccine greater than or equal to 7yo IM - RPR - CBC - ibuprofen (ADVIL) 800 MG tablet; Take 1 tablet (800 mg total) by mouth every 6 (six) hours as needed.  Dispense: 30 tablet; Refill: 2 - ondansetron (ZOFRAN ODT) 8 MG disintegrating tablet; Take 1 tablet (8 mg total) by mouth every 8 (eight) hours as needed for nausea or vomiting.  Dispense: 20 tablet; Refill: 1 - traMADol (ULTRAM) 50 MG tablet; Take 1-2 tablets (50-100 mg total) by mouth every 6 (six) hours as needed for severe pain.  Dispense: 30 tablet; Refill: 0 - US Pelvis Complete; Future - Cervicovaginal ancillary only( [place]) - pelvic US ordered    Will follow up results of pap smear and manage accordingly. Routine preventative health maintenance measures emphasized. Please refer to After Visit Summary for other counseling recommendations.    , Artist Pais, [place] [facility] [employer], [place]

## 2019-03-24 LAB — CBC
Hematocrit: 37.1 % (ref 34.0–46.6)
Hemoglobin: 11.1 g/dL (ref 11.1–15.9)
MCH: 23.8 pg — ABNORMAL LOW (ref 26.6–33.0)
MCHC: 29.9 g/dL — ABNORMAL LOW (ref 31.5–35.7)
MCV: 80 fL (ref 79–97)
Platelets: 371 10*3/uL (ref 150–450)
RBC: 4.66 x10E6/uL (ref 3.77–5.28)
RDW: 15 % (ref 11.7–15.4)
WBC: 7 10*3/uL (ref 3.4–10.8)

## 2019-03-24 LAB — RPR: RPR Ser Ql: NONREACTIVE

## 2019-03-24 LAB — HIV ANTIBODY (ROUTINE TESTING W REFLEX): HIV Screen 4th Generation wRfx: NONREACTIVE

## 2019-03-27 ENCOUNTER — Other Ambulatory Visit: Payer: Self-pay | Admitting: Obstetrics and Gynecology

## 2019-03-27 LAB — CERVICOVAGINAL ANCILLARY ONLY
Bacterial Vaginitis (gardnerella): POSITIVE — AB
Candida Glabrata: NEGATIVE
Candida Vaginitis: NEGATIVE
Chlamydia: NEGATIVE
Neisseria Gonorrhea: NEGATIVE
Trichomonas: NEGATIVE

## 2019-03-27 MED ORDER — METRONIDAZOLE 500 MG PO TABS: 500.0000 mg | ORAL_TABLET | Freq: Two times a day (BID) | ORAL | 0 refills | Status: DC

## 2019-03-27 NOTE — Progress Notes (Unsigned)
+   BV. Flagyl sent Mychart message sent.   Lezlie Lye, NP 03/27/2019 8:36 PM

## 2019-03-30 ENCOUNTER — Other Ambulatory Visit: Payer: Self-pay

## 2019-03-30 ENCOUNTER — Other Ambulatory Visit: Payer: Self-pay | Admitting: Obstetrics and Gynecology

## 2019-03-30 ENCOUNTER — Ambulatory Visit (HOSPITAL_COMMUNITY)
Admission: RE | Admit: 2019-03-30 | Discharge: 2019-03-30 | Disposition: A | Discharge status: Home / Self Care | Payer: No Typology Code available for payment source | Source: Ambulatory Visit | Attending: Obstetrics and Gynecology | Admitting: Obstetrics and Gynecology

## 2019-03-30 DIAGNOSIS — N946 Dysmenorrhea, unspecified: Secondary | ICD-10-CM

## 2019-03-30 DIAGNOSIS — Z01419 Encounter for gynecological examination (general) (routine) without abnormal findings: Secondary | ICD-10-CM

## 2019-07-18 ENCOUNTER — Other Ambulatory Visit: Payer: Self-pay

## 2019-07-18 ENCOUNTER — Ambulatory Visit
Admission: RE | Admit: 2019-07-18 | Discharge: 2019-07-18 | Disposition: A | Discharge status: Home / Self Care | Payer: No Typology Code available for payment source | Source: Ambulatory Visit | Attending: Physician Assistant | Admitting: Physician Assistant

## 2019-07-18 DIAGNOSIS — R059 Cough, unspecified: Secondary | ICD-10-CM

## 2019-07-18 DIAGNOSIS — R05 Cough: Secondary | ICD-10-CM

## 2019-07-27 MED FILL — TOPIRAMATE 25 MG TAB: 25 | 30 days supply | Qty: 60 | Fill #0

## 2019-09-03 ENCOUNTER — Emergency Department (HOSPITAL_COMMUNITY)
Admission: EM | Admit: 2019-09-03 | Discharge: 2019-09-03 | Disposition: A | Discharge status: Home / Self Care | Payer: No Typology Code available for payment source | Attending: Emergency Medicine | Admitting: Emergency Medicine

## 2019-09-03 ENCOUNTER — Other Ambulatory Visit: Payer: Self-pay

## 2019-09-03 ENCOUNTER — Encounter (HOSPITAL_COMMUNITY): Payer: Self-pay | Admitting: Emergency Medicine

## 2019-09-03 DIAGNOSIS — Z79899 Other long term (current) drug therapy: Secondary | ICD-10-CM | POA: Diagnosis not present

## 2019-09-03 DIAGNOSIS — T782XXA Anaphylactic shock, unspecified, initial encounter: Secondary | ICD-10-CM | POA: Insufficient documentation

## 2019-09-03 DIAGNOSIS — N3000 Acute cystitis without hematuria: Secondary | ICD-10-CM | POA: Diagnosis not present

## 2019-09-03 DIAGNOSIS — T7840XA Allergy, unspecified, initial encounter: Secondary | ICD-10-CM | POA: Diagnosis present

## 2019-09-03 HISTORY — DX: Migraine, unspecified, not intractable, without status migrainosus: G43.909

## 2019-09-03 LAB — URINALYSIS, ROUTINE W REFLEX MICROSCOPIC
Bilirubin Urine: NEGATIVE
Glucose, UA: NEGATIVE mg/dL
Hgb urine dipstick: NEGATIVE
Ketones, ur: NEGATIVE mg/dL
Leukocytes,Ua: NEGATIVE
Nitrite: POSITIVE — AB
Protein, ur: NEGATIVE mg/dL
Specific Gravity, Urine: 1.003 — ABNORMAL LOW (ref 1.005–1.030)
pH: 6 (ref 5.0–8.0)

## 2019-09-03 MED ORDER — DIPHENHYDRAMINE HCL 50 MG/ML IJ SOLN
25.0000 mg | Freq: Once | INTRAMUSCULAR | Status: AC
Start: 2019-09-03 — End: 2019-09-03
  Administered 2019-09-03: 25 mg via INTRAVENOUS
  Filled 2019-09-03: qty 1

## 2019-09-03 MED ORDER — EPINEPHRINE 0.3 MG/0.3ML IJ SOAJ: 0.3000 mg | INTRAMUSCULAR | 0 refills | Status: DC | PRN

## 2019-09-03 MED ORDER — FOSFOMYCIN TROMETHAMINE 3 G PO PACK
3.0000 g | PACK | Freq: Once | ORAL | Status: AC
  Administered 2019-09-03: 16:00:00 3 g via ORAL
  Filled 2019-09-03: qty 3

## 2019-09-03 MED ORDER — FAMOTIDINE IN NACL 20-0.9 MG/50ML-% IV SOLN
20.0000 mg | Freq: Once | INTRAVENOUS | Status: AC
Start: 2019-09-03 — End: 2019-09-03
  Administered 2019-09-03: 20 mg via INTRAVENOUS
  Filled 2019-09-03: qty 50

## 2019-09-03 MED ORDER — SODIUM CHLORIDE 0.9 % IV BOLUS
1000.0000 mL | Freq: Once | INTRAVENOUS | Status: AC
Start: 2019-09-03 — End: 2019-09-03
  Administered 2019-09-03: 10:00:00 1000 mL via INTRAVENOUS

## 2019-09-03 MED ORDER — METHYLPREDNISOLONE SODIUM SUCC 125 MG IJ SOLR
125.0000 mg | Freq: Once | INTRAMUSCULAR | Status: AC
Start: 2019-09-03 — End: 2019-09-03
  Administered 2019-09-03: 10:00:00 125 mg via INTRAVENOUS
  Filled 2019-09-03: qty 2

## 2019-09-03 MED ORDER — PREDNISONE 1 MG PO TABS: 50.0000 mg | ORAL_TABLET | Freq: Every day | ORAL | 0 refills | Status: DC

## 2019-09-03 NOTE — ED Provider Notes (Signed)
[facility] EMERGENCY DEPARTMENT Provider Note   CSN: [id number] Arrival date & time: 09/03/19  0920     History Chief Complaint  Patient presents with  . Allergic Reaction    Kristine Johnson is a 34 y.o. female.  The history is provided by the patient and medical records. No language interpreter was used.  Allergic Reaction Presenting symptoms: difficulty breathing, itching, rash and swelling   Severity:  Moderate Duration:  6 hours Prior allergic episodes:  No prior episodes Context: medications   Relieved by:  Nothing Worsened by:  Nothing Ineffective treatments:  None tried      Past Medical History:  Diagnosis Date  . Migraine     There are no problems to display for this patient.   History reviewed. No pertinent surgical history.   OB History   No obstetric history on file.     No family history on file.  Social History   Tobacco Use  . Smoking status: Never Smoker  . Smokeless tobacco: Never Used  Substance Use Topics  . Alcohol use: Not Currently  . Drug use: Not Currently    Home Medications Prior to Admission medications   Medication Sig Start Date End Date Taking? Authorizing Provider  acetaminophen (TYLENOL) 500 MG tablet Take 500 mg by mouth every 6 (six) hours as needed.    [provider]  fluticasone (FLONASE) 50 MCG/ACT nasal spray Place 2 sprays into both nostrils daily for 10 days. 06/25/18 07/05/18  Kara Dies, NP  fluticasone (FLONASE) 50 MCG/ACT nasal spray Place 2 sprays into both nostrils daily for 10 days. 08/05/18 08/15/18  Kara Dies, NP  ibuprofen (ADVIL) 800 MG tablet Take 1 tablet (800 mg total) by mouth every 6 (six) hours as needed. 03/23/19   Rasch, Anderson Malta I, NP  metroNIDAZOLE (FLAGYL) 500 MG tablet Take 1 tablet (500 mg total) by mouth 2 (two) times daily. 03/27/19   Rasch, Anderson Malta I, NP  montelukast (SINGULAIR) 10 MG tablet Take 1 tablet (10 mg total) by mouth  at bedtime. 06/25/18 07/25/18  Kara Dies, NP  Multiple Vitamin (MULTIVITAMIN) tablet Take 1 tablet by mouth daily.    [provider]  ondansetron (ZOFRAN ODT) 8 MG disintegrating tablet Take 1 tablet (8 mg total) by mouth every 8 (eight) hours as needed for nausea or vomiting. 03/23/19   Rasch, Artist Pais, NP  Prenatal Vit-Fe Fumarate-FA (PRENATAL MULTIVITAMIN) TABS tablet Take 1 tablet by mouth daily at 12 noon.    [provider]  traMADol (ULTRAM) 50 MG tablet Take 1-2 tablets (50-100 mg total) by mouth every 6 (six) hours as needed for severe pain. 03/23/19   Rasch, Artist Pais, NP    Allergies    Patient has no known allergies.  Review of Systems   Review of Systems  Constitutional: Negative for chills, fatigue and fever.  HENT: Positive for facial swelling. Negative for congestion.   Eyes: Negative for visual disturbance.  Respiratory: Positive for shortness of breath. Negative for chest tightness.   Cardiovascular: Negative for chest pain.  Gastrointestinal: Negative for abdominal pain, constipation, diarrhea and nausea.  Genitourinary: Positive for frequency and urgency.  Musculoskeletal: Negative for back pain, neck pain and neck stiffness.  Skin: Positive for itching and rash. Negative for wound.  Neurological: Negative for light-headedness and headaches.  Psychiatric/Behavioral: Negative for agitation.  All other systems reviewed and are negative.   Physical Exam Updated Vital Signs BP (!) 131/97 (BP Location: Left Arm)  Pulse 84   Temp 98.8 F (37.1 C) (Oral)   Resp 16   LMP 08/17/2019   SpO2 100%   Physical Exam Vitals and nursing note reviewed.  Constitutional:      General: She is not in acute distress.    Appearance: She is well-developed. She is not ill-appearing, toxic-appearing or diaphoretic.  HENT:     Head: Atraumatic.     Right Ear: External ear normal.     Left Ear: External ear normal.     Nose: Nose normal. No congestion  or rhinorrhea.     Mouth/Throat:     Mouth: Mucous membranes are moist.     Pharynx: Oropharynx is clear. No oropharyngeal exudate or posterior oropharyngeal erythema.     Comments: Mild upper lip swelling. Eyes:     Conjunctiva/sclera: Conjunctivae normal.     Pupils: Pupils are equal, round, and reactive to light.  Cardiovascular:     Rate and Rhythm: Normal rate.     Pulses: Normal pulses.     Heart sounds: No murmur.  Pulmonary:     Effort: Pulmonary effort is normal. No respiratory distress.     Breath sounds: No stridor. No wheezing, rhonchi or rales.  Chest:     Chest wall: No tenderness.  Abdominal:     General: Abdomen is flat. There is no distension.     Tenderness: There is no abdominal tenderness. There is no right CVA tenderness, left CVA tenderness or rebound.  Musculoskeletal:        General: No tenderness.     Cervical back: Normal range of motion and neck supple. No tenderness.     Right lower leg: No edema.     Left lower leg: No edema.  Skin:    General: Skin is warm.     Capillary Refill: Capillary refill takes less than 2 seconds.     Findings: Rash present. No erythema.  Neurological:     General: No focal deficit present.     Mental Status: She is alert and oriented to person, place, and time.     Cranial Nerves: No cranial nerve deficit.     Sensory: No sensory deficit.     Motor: No weakness or abnormal muscle tone.     Deep Tendon Reflexes: Reflexes are normal and symmetric.  Psychiatric:        Mood and Affect: Mood normal.     ED Results / Procedures / Treatments   Labs (all labs ordered are listed, but only abnormal results are displayed) Labs Reviewed  URINALYSIS, ROUTINE W REFLEX MICROSCOPIC - Abnormal; Notable for the following components:      Result Value   Color, Urine AMBER (*)    Specific Gravity, Urine 1.003 (*)    Nitrite POSITIVE (*)    Bacteria, UA RARE (*)    All other components within normal limits  URINE CULTURE  I-STAT  BETA HCG BLOOD, ED ([facility], [facility], [facility] ONLY)    EKG None  Radiology No results found.  Procedures Procedures (including critical care time)  Medications Ordered in ED Medications  fosfomycin (MONUROL) packet 3 g (has no administration in time range)  sodium chloride 0.9 % bolus 1,000 mL (1,000 mLs Intravenous New Bag/Given 09/03/19 1017)  diphenhydrAMINE (BENADRYL) injection 25 mg (25 mg Intravenous Given 09/03/19 1017)  methylPREDNISolone sodium succinate (SOLU-MEDROL) 125 mg/2 mL injection 125 mg (125 mg Intravenous Given 09/03/19 1018)  famotidine (PEPCID) IVPB 20 mg premix (0 mg Intravenous Stopped 09/03/19 1206)  ED Course  I have reviewed the triage vital signs and the nursing notes.  Pertinent labs & imaging results that were available during my care of the patient were reviewed by me and considered in my medical decision making (see chart for details).    MDM Rules/Calculators/[name]                      Kristine Johnson is a 34 y.o. female with a past medical history significant for chronic migraines who presents with allergic reaction.  Patient reports that she had her second [place] shot on Thursday, 4 days ago and did not have significant allergic type symptoms.  She did have fatigue, malaise, and diffuse aches as well as some fever but that has since resolved after Friday.  She reports that she did develop some urinary urgency and frequency and thought she may have a UTI.  Patient took "Equate urinary pain relief" medication last night at 6 PM to try and help her urinary symptoms.  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  Patient is never had significant allergic reaction before and took Benadryl at home but this did not help.  Patient then come to the emergency department for evaluation.  On arrival, she  reports that her shortness of breath has improved and she does not feel that she is wheezing.  She reports that she still having itching, the diffuse rash, and still feels her upper lip is slightly swollen.  She denies any sensation that her throat is closing or any worsening shortness of breath.  She denies any tongue swelling.  She denies history of anaphylaxis in the past.  She denies any other new medications or other new foods or exposures.  She denies any work exposures.  She denies any current pain.  She denies any neurologic deficits otherwise.  On exam, patient does have some mild swelling to her upper lip symmetrically.  No tongue or posterior oropharyngeal edema seen.  No stridor.  Lungs are clear with no wheezing.  Chest and abdomen nontender.  No focal neurologic deficits.  Patient does have faint diffuse urticarial rash and pruritus with normal grip strength and sensation throughout.  Patient otherwise resting comfortably with reassuring vital signs on room air.  Suspect allergic reaction to the urinary treatment medication.  Review of the patient's picture shows that it has a combination of phenazopyridine I chloride as the active ingredient.  Patient will be instructed to avoid this.  We will give her IV Solu-Medrol, IV Benadryl, IV Pepcid, and fluids and monitor the patient.  Anticipate discharge with a burst of steroids and EpiPen prescription.  At this time, I do not feel she needs EpiPen and patient agrees to hold on epi at this time.  Anticipate reassessment in several hours to make sure she has no worsening reaction and discharge.  Patient's urinalysis does confirm UTI with nitrites and bacteria.  Culture will be sent.  Patient had improvement of her allergic reaction during her ED stay.  She will be started on prednisone and instructed to take oral antihistamines at home.  She will also be given prescription for EpiPen.  She will follow-up with her PCP for allergy testing and  agreed with return precautions and plan of care.  Patient discharged in good condition.   Final  Clinical Impression(s) / ED Diagnoses Final diagnoses:  Acute cystitis without hematuria  Allergic reaction, initial encounter  Anaphylaxis, initial encounter    Rx / DC Orders ED Discharge Orders         Ordered    predniSONE (DELTASONE) 1 MG tablet  Daily     09/03/19 1540    EPINEPHrine 0.3 mg/0.3 mL IJ SOAJ injection  As needed     09/03/19 1542          Clinical Impression: 1. Acute cystitis without hematuria   2. Allergic reaction, initial encounter   3. Anaphylaxis, initial encounter     Disposition: Discharge  Condition: Good  I have discussed the results, Dx and Tx plan with the pt(& family if present). He/she/they expressed understanding and agree(s) with the plan. Discharge instructions discussed at great length. Strict return precautions discussed and pt &/or family have verbalized understanding of the instructions. No further questions at time of discharge.    New Prescriptions   EPINEPHRINE 0.3 MG/0.3 ML IJ SOAJ INJECTION    Inject 0.3 mLs (0.3 mg total) into the muscle as needed for anaphylaxis.   PREDNISONE (DELTASONE) 1 MG TABLET    Take 50 tablets (50 mg total) by mouth daily. Take 1 tablet (50 mg total) every day for the next 4 days starting tomorrow    Follow Up: [facility] at [place] [address] [phone]     [facility] [address] [id number] [facility]       Izora Benn, Gwenyth Allegra, MD 09/03/19 1606

## 2019-09-03 NOTE — Discharge Instructions (Addendum)
Your work-up today did show evidence of UTI.  You were treated with the one-time antibiotic we discussed.  Please follow-up with your primary doctor and allergist for further allergy testing.  Please use the steroids for the next several days and use antihistamines to help with itching.  Please fill the prescription for EpiPen and avoid that urinary medication.  If any symptoms change or worsen, please return to the nearest emergency department.

## 2019-09-03 NOTE — ED Triage Notes (Signed)
C/o hives, mild SOB, itching all over, and swelling to lips since 6am.  Pt had COVID vaccine on Thursday.  Took OTC Equate Urinary Relief medication at 6pm yesterday.  Took 1 Benadryl at 8am.

## 2019-09-04 LAB — URINE CULTURE: Culture: <10000 — AB

## 2019-09-04 LAB — I-STAT BETA HCG BLOOD, ED (MC, WL, AP ONLY): I-stat hCG, quantitative: <5 m[IU]/mL (ref <5)

## 2019-09-07 MED FILL — TOPIRAMATE 25 MG TAB: 25 | 30 days supply | Qty: 60 | Fill #1

## 2019-10-12 MED FILL — TOPIRAMATE 25 MG TAB: 25 | 30 days supply | Qty: 60 | Fill #2

## 2019-10-16 IMAGING — US US PELVIS COMPLETE WITH TRANSVAGINAL
1 series · 14 of 25 positions shown · non-contrast
Comparison: None

CLINICAL DATA: Abnormal heavy and painful menstrual periods



[Series 1: us pelvis complete with transvaginal · 14 of 102 slices shown]
[im 1/102]
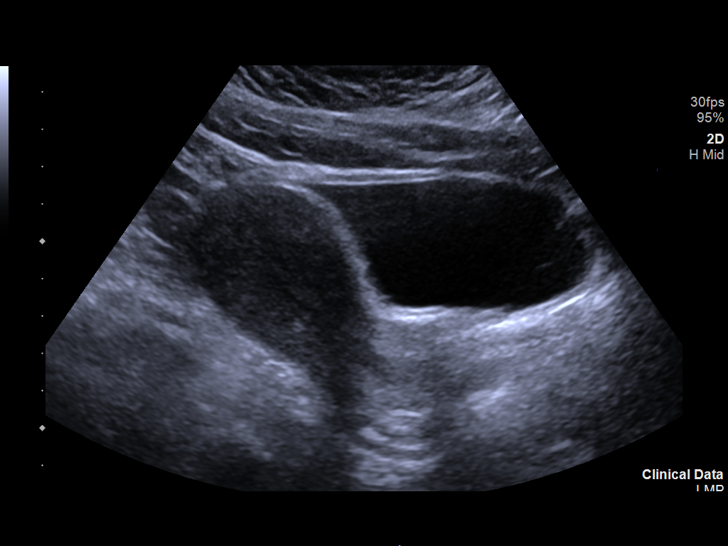
[im 9/102]
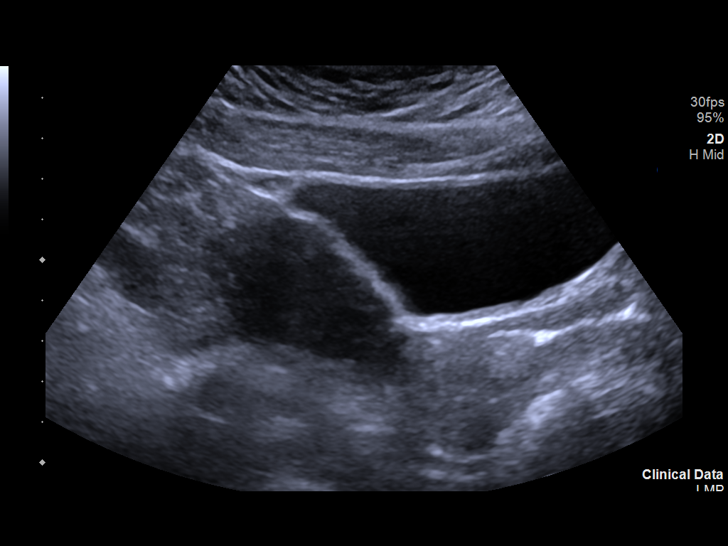
[im 17/102]
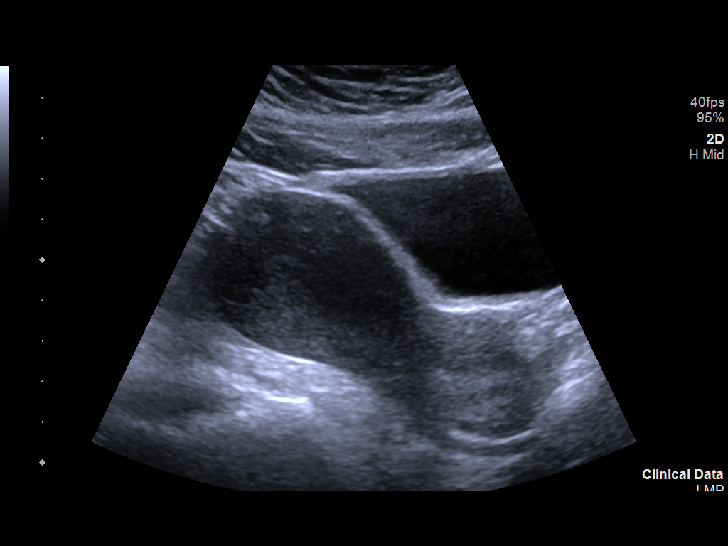
[im 26/102]
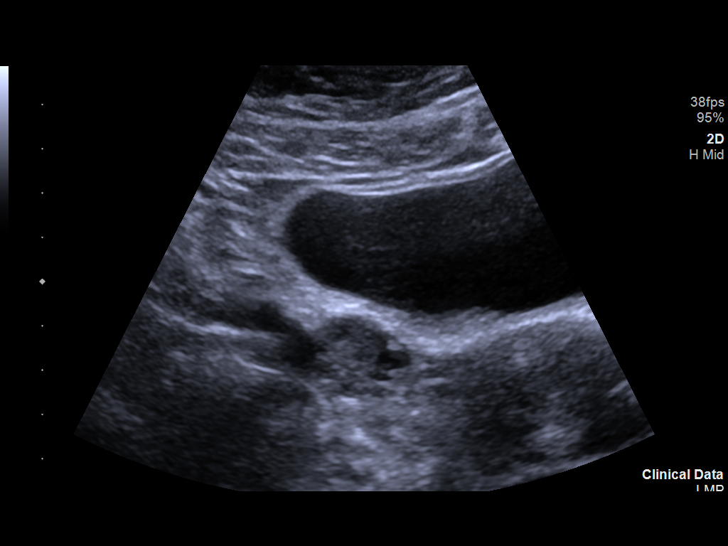
[im 34/102]
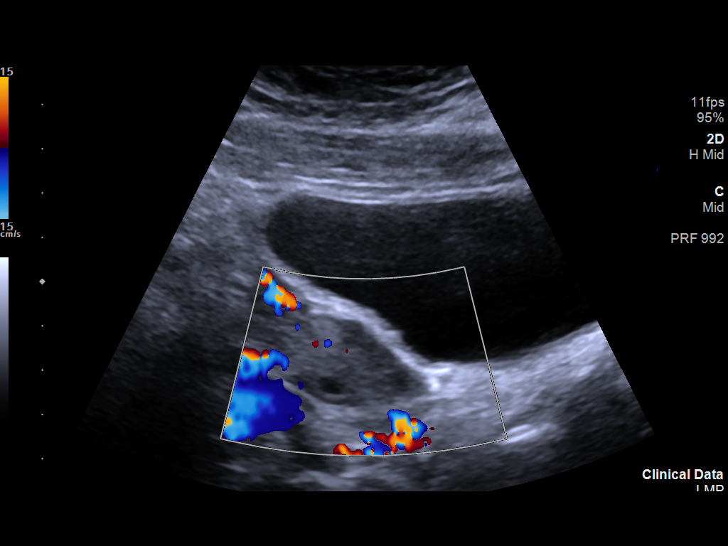
[im 38/102]
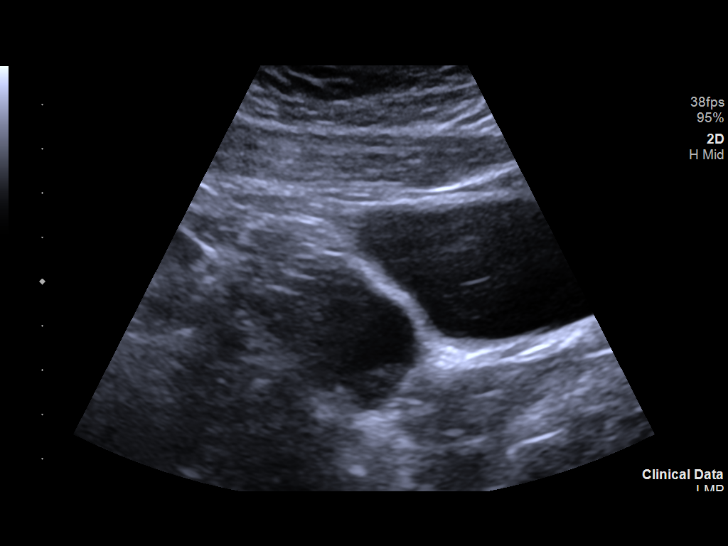
[im 47/102]
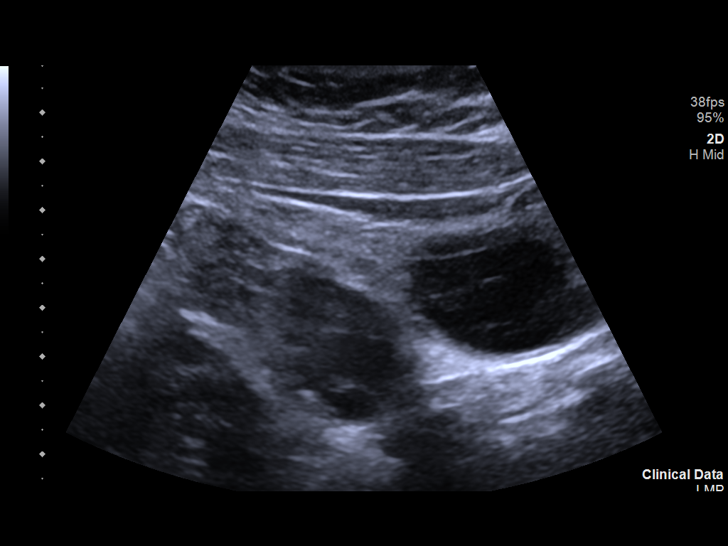
[im 55/102]
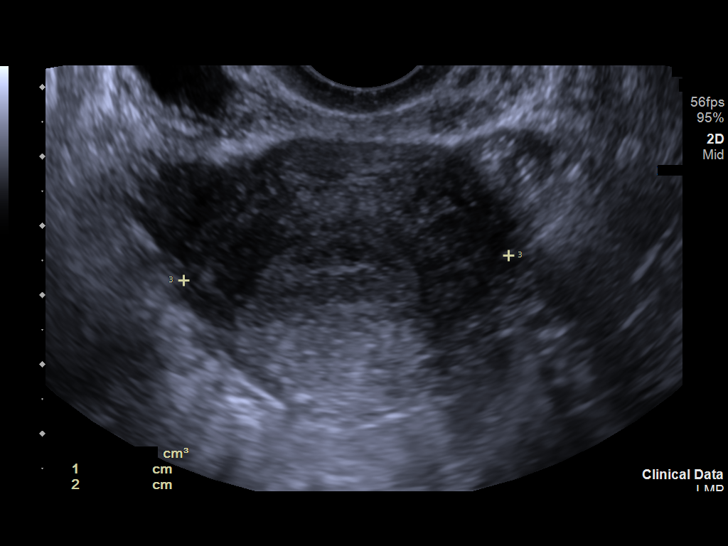
[im 64/102]
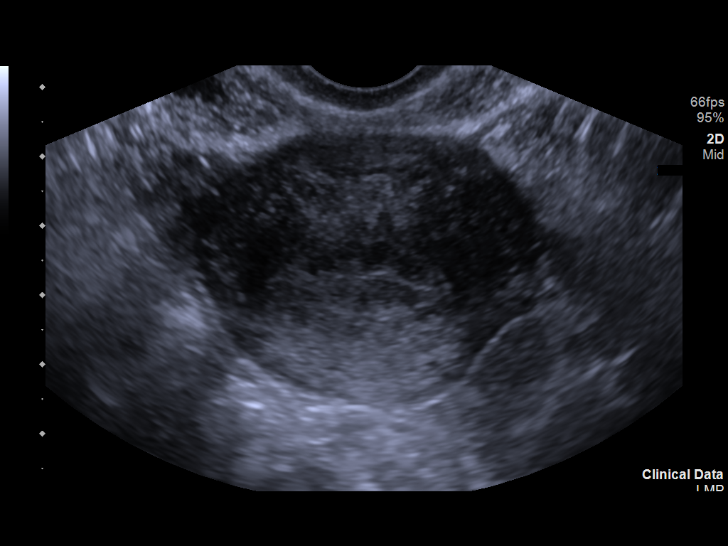
[im 68/102]
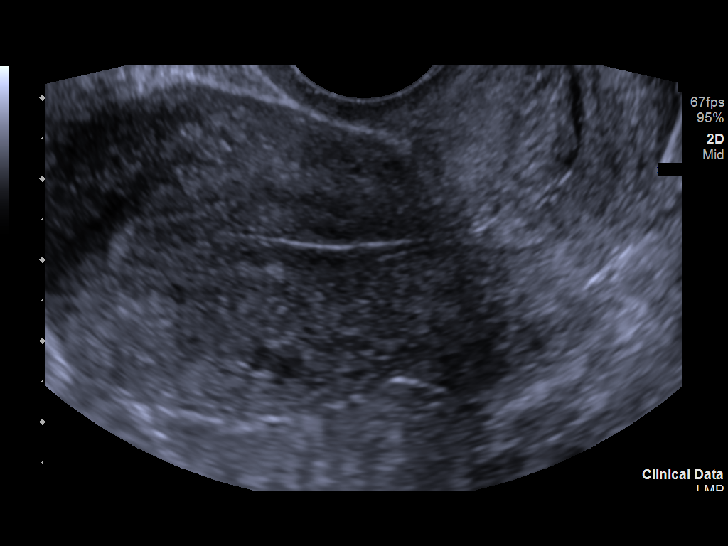
[im 76/102]
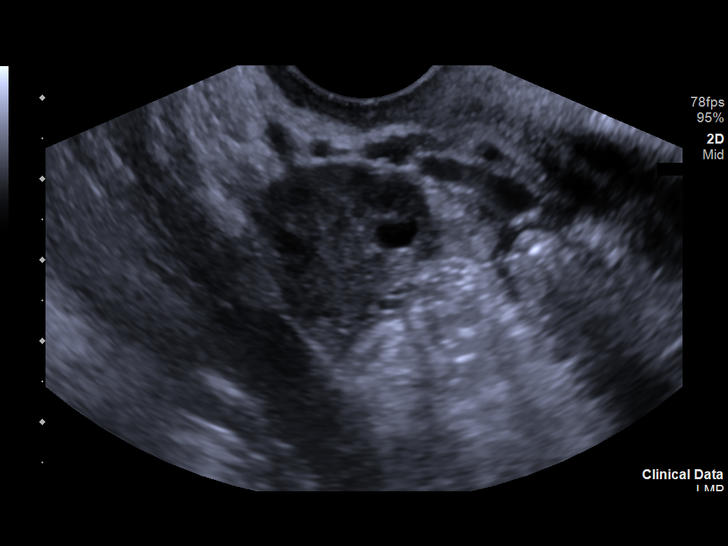
[im 85/102]
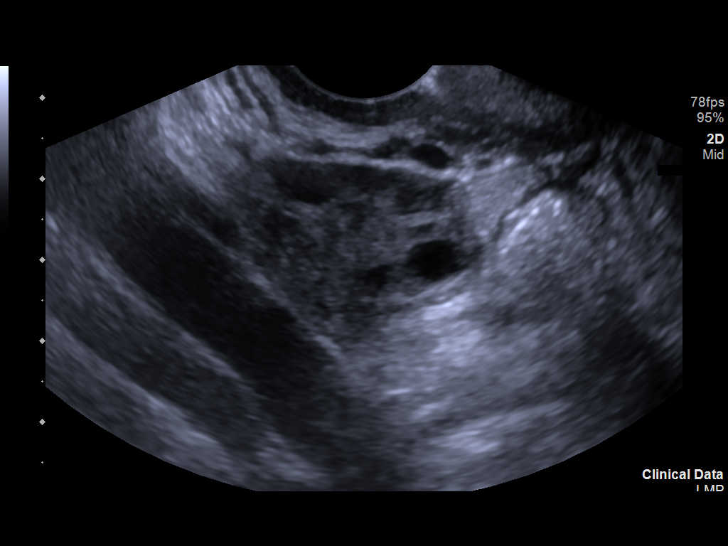
[im 93/102]
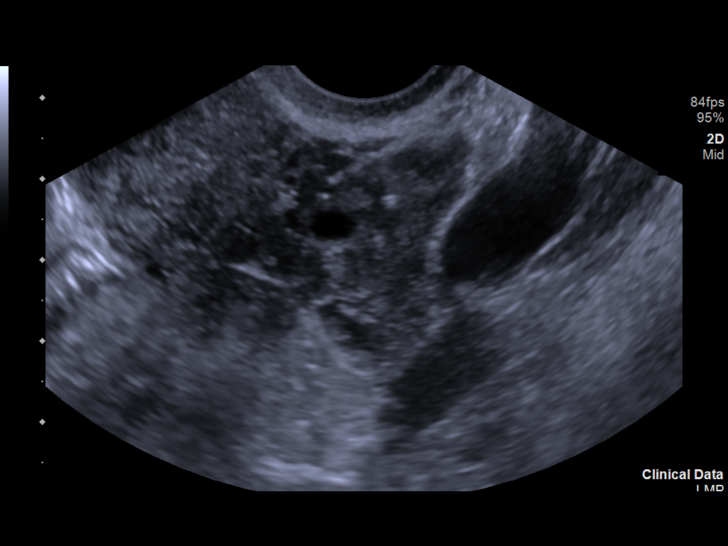
[im 102/102]
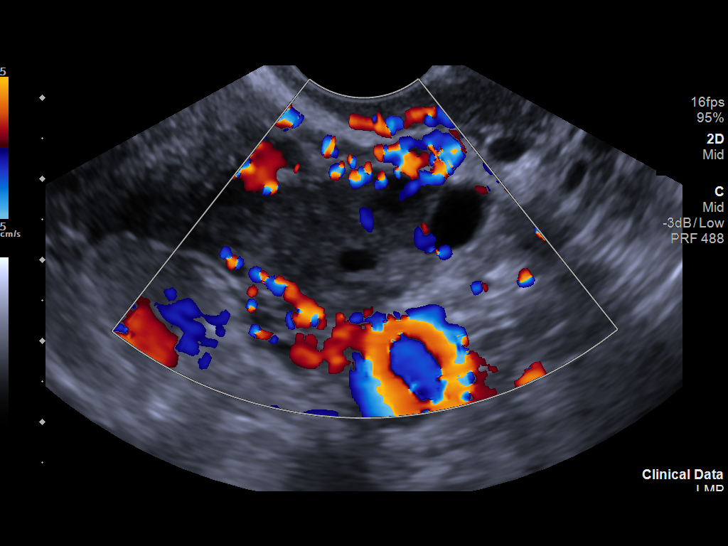

[14 of 25 positions shown; findings below may reference images not displayed]

FINDINGS: Uterus

Measurements: 8.4 x 4.4 x 4.7 cm = volume: 90 mL. Mildly
heterogeneous myometrium without focal mass

Endometrium

Thickness: 12 mm.  No endometrial fluid or focal abnormality

Right ovary

Measurements: 2.9 x 2.1 x 2.3 cm = volume: 7.2 mL. Normal morphology
without mass. Internal blood flow present on color Doppler imaging.

Left ovary

Measurements: 3.2 x 2.3 x 2.8 cm = volume: 11.0 mL. Normal
morphology without mass. Single tiny calcification. Internal blood
flow present on color Doppler imaging.

Other findings

Trace free pelvic fluid.  No adnexal masses.
IMPRESSION: No significant pelvic sonographic abnormalities.

## 2019-12-05 MED FILL — IBUPROFEN 800 MG TABS: 800 | 8 days supply | Qty: 30 | Fill #0

## 2019-12-11 MED FILL — TOPIRAMATE 25 MG TAB: 25 | 30 days supply | Qty: 60 | Fill #3

## 2019-12-14 ENCOUNTER — Other Ambulatory Visit (HOSPITAL_COMMUNITY): Payer: Self-pay | Admitting: Family Medicine

## 2019-12-14 ENCOUNTER — Telehealth: Payer: Self-pay | Admitting: Obstetrics and Gynecology

## 2019-12-14 MED FILL — CEPHALEXIN 500 MG CAPSULE: 500 | 10 days supply | Qty: 20 | Fill #0

## 2019-12-14 MED FILL — FLUCONAZOLE 150 MG TABS: 150 | 1 days supply | Qty: 1 | Fill #0

## 2019-12-14 NOTE — Telephone Encounter (Signed)
Attempted to contact patient to let her know that her appointment on 6/28 has been canceled because she is not due for an annual exam until 03/2020. No answer, left voicemail for patient to give the office a call back. In regards to this matter.

## 2019-12-15 ENCOUNTER — Telehealth: Payer: Self-pay | Admitting: *Deleted

## 2019-12-15 NOTE — Telephone Encounter (Signed)
I called Kristine Johnson and left a message I was calling back about her request and since I did not reach you; I will send you a detailed MyChart message. Please read the message and if you have questions; you may call us Monday as we close for the weekend. Jacques Navy

## 2019-12-15 NOTE — Telephone Encounter (Signed)
-----   Message from Rada Hay sent at 12/14/2019  3:38 PM EDT -----  ----- Message ----- From: Lezlie Lye, NP Sent: 12/14/2019   2:15 PM EDT To: Rada Hay  Please let the patient know that I cannot refill her tramadol without seeing her. If she is continuing to have pelvic pain she should be seen for a problem visit to discuss further.  If she can wait until Oct for her annual we can discuss this then.   Thank you,  Anderson Malta  ----- Message ----- From: Rada Hay Sent: 12/14/2019  11:52 AM EDT To: Lezlie Lye, NP  Appointment has been canceled. She would like to have a refill on her prescription tramadol. Can you are the nurse reach out to her please about this.  ----- Message ----- From: Lezlie Lye, NP Sent: 12/13/2019   1:08 PM EDT To: [facility] Admin [facility]  This patient is on my schedule 6/28 for annual and pap.   It has not been a year since her last annual and she is not due. Her last annual was 03/2019- she will need to wait a whole year before we can do another annual.   Please call her and let her know.  Anderson Malta

## 2019-12-18 ENCOUNTER — Ambulatory Visit: Payer: No Typology Code available for payment source | Admitting: Obstetrics and Gynecology

## 2020-01-10 MED FILL — TOPIRAMATE 25 MG TAB: 25 | 30 days supply | Qty: 60 | Fill #4

## 2020-02-03 IMAGING — CR DG CHEST 2V
2 series · 2 of 2 positions shown · non-contrast
Comparison: None.

CLINICAL DATA: Cough, vomiting, congestion

EXAM:
CHEST - 2 VIEW

[w chest pa]
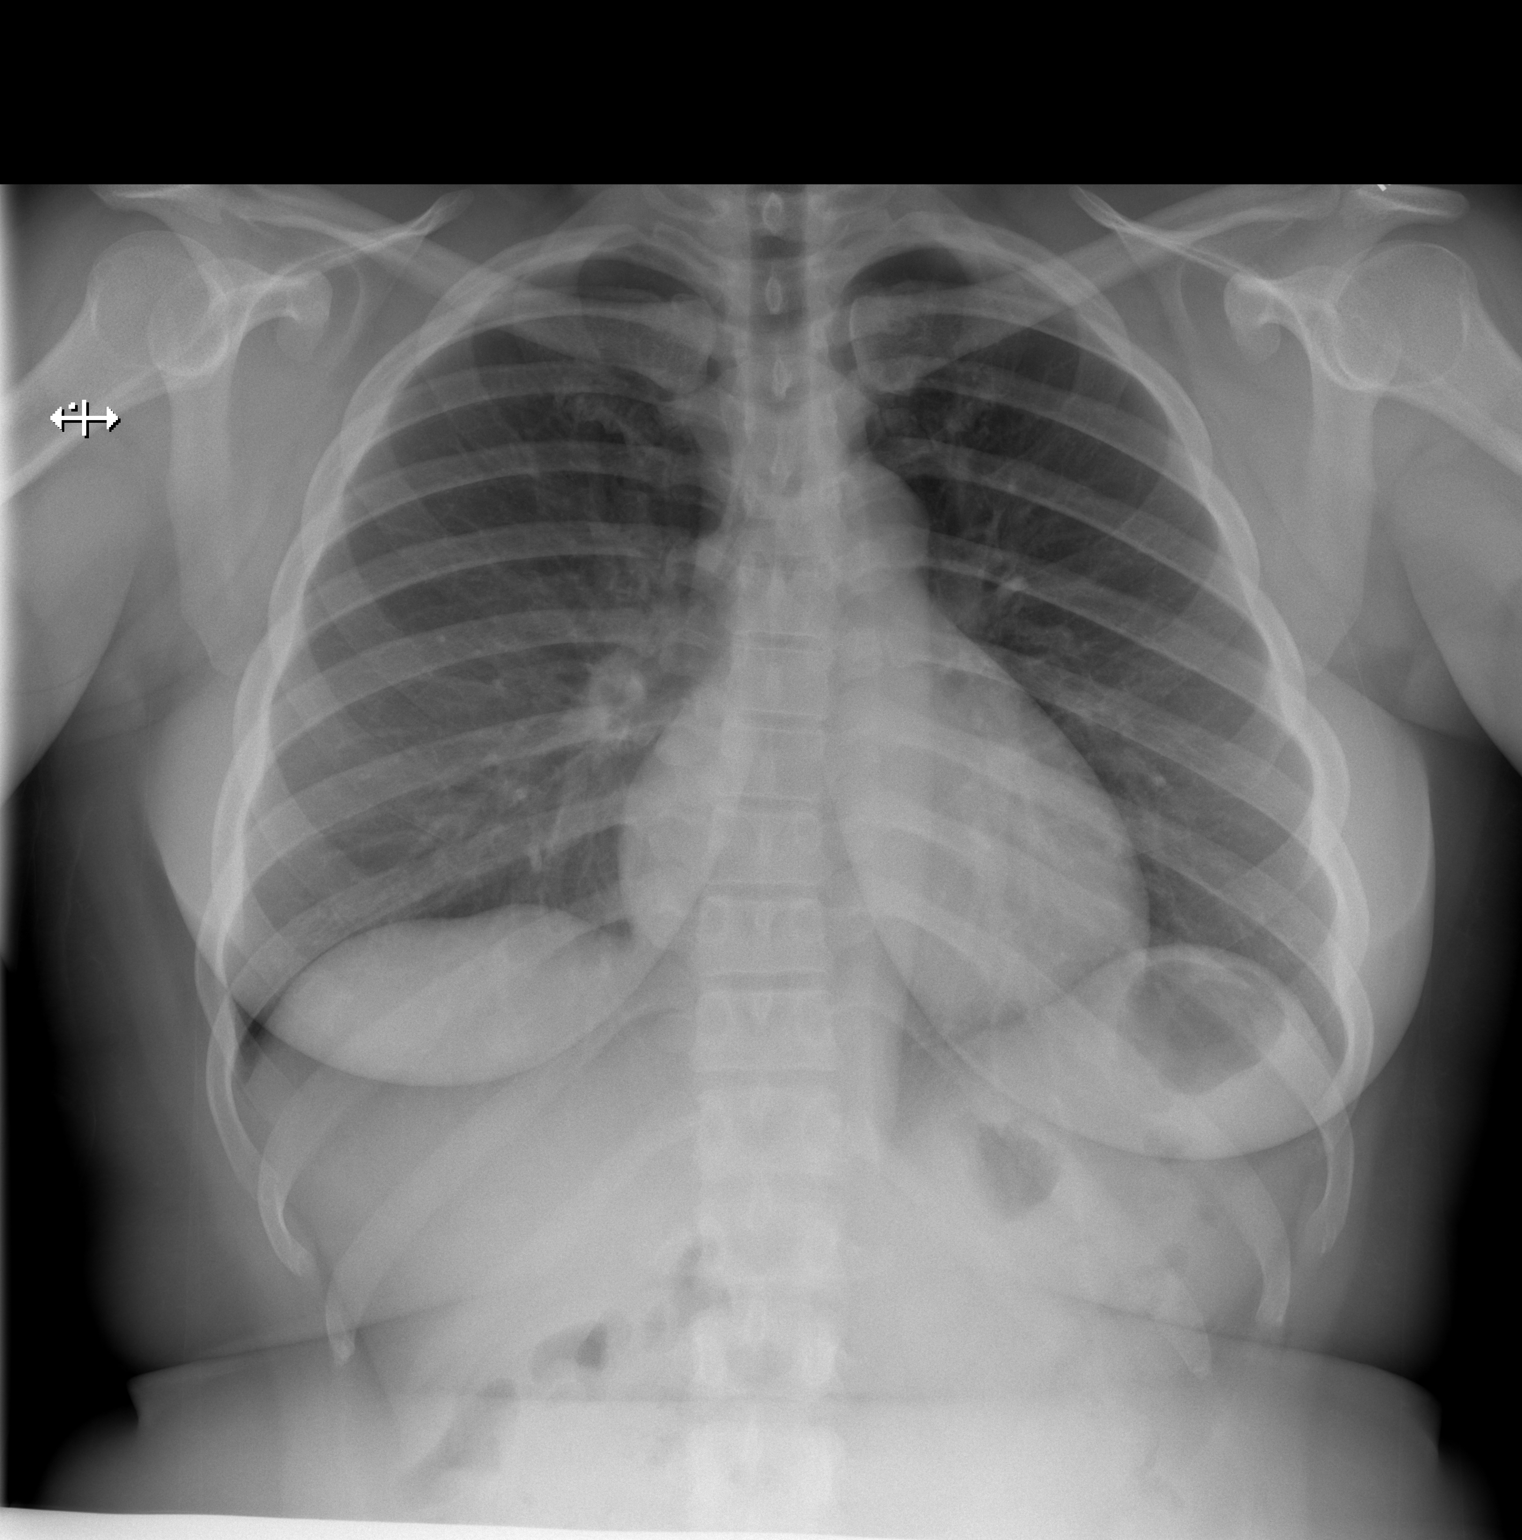

[w chest lat]
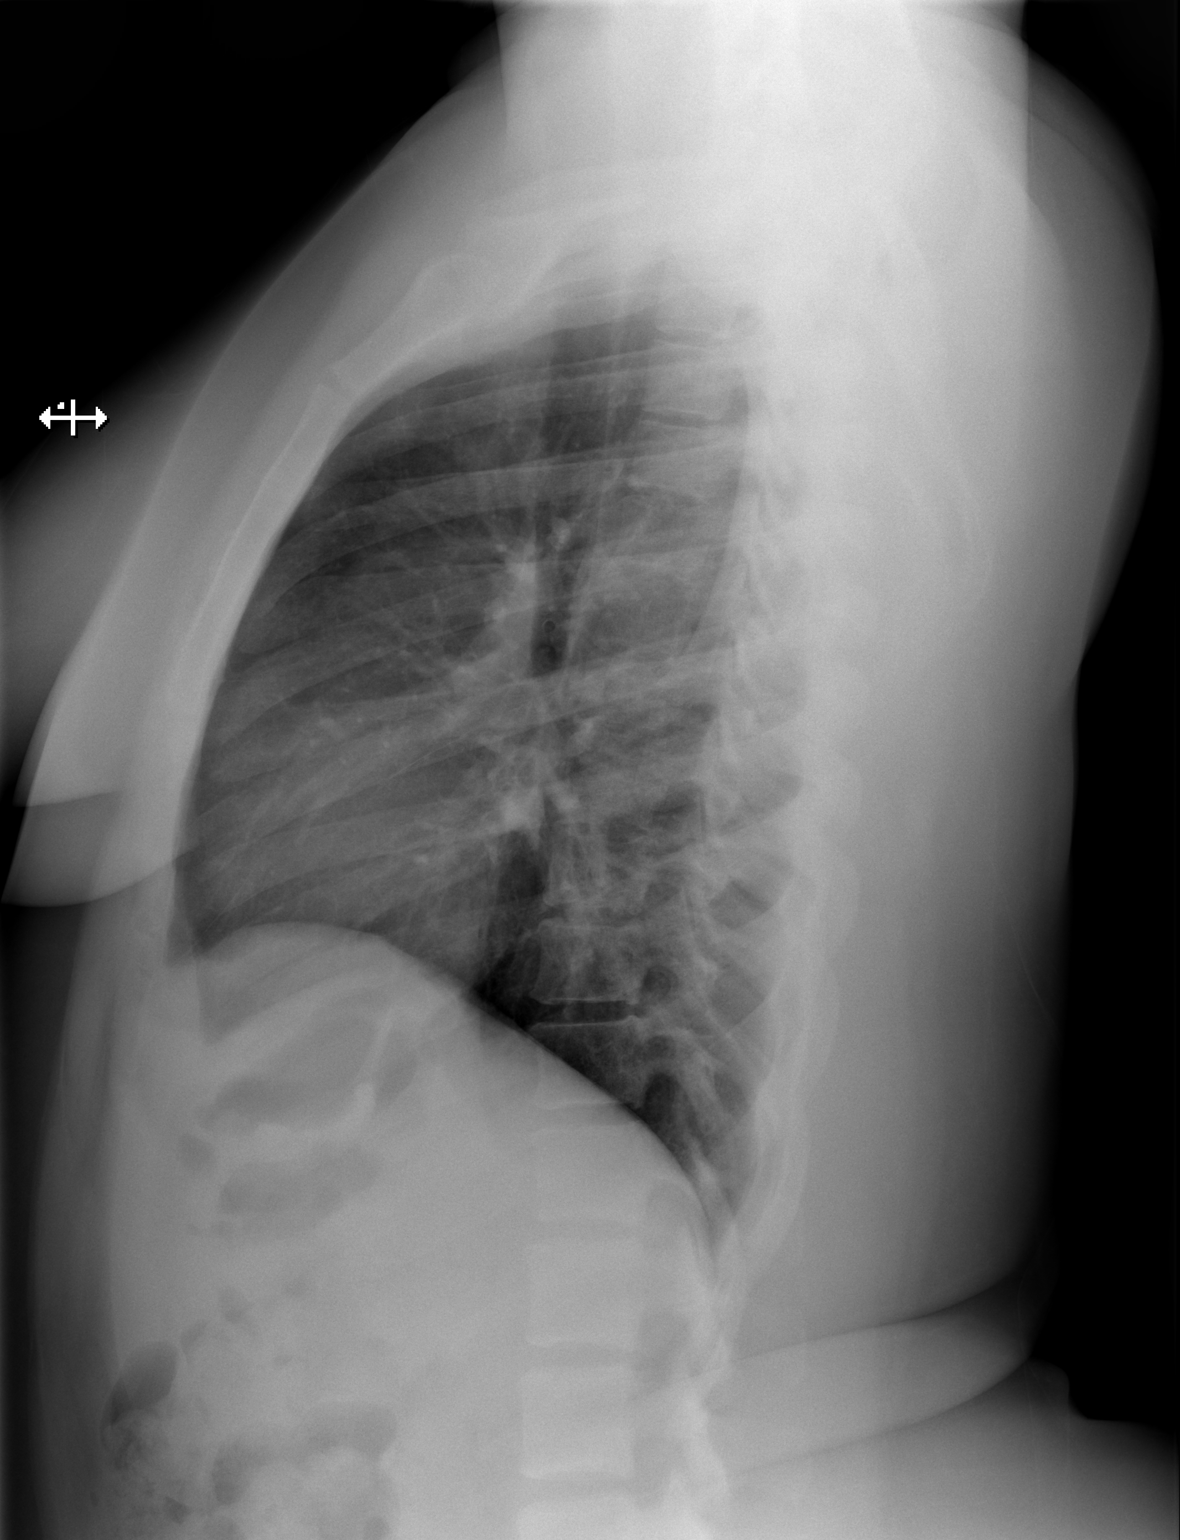

[2 of 2 positions shown; findings below may reference images not displayed]

FINDINGS: The heart size and mediastinal contours are within normal limits.
Both lungs are clear. The visualized skeletal structures are
unremarkable.
IMPRESSION: No active cardiopulmonary disease.

## 2020-05-27 MED FILL — FLUCONAZOLE 150 MG TABS: 150 | 1 days supply | Qty: 1 | Fill #1

## 2020-09-24 ENCOUNTER — Other Ambulatory Visit (HOSPITAL_COMMUNITY): Payer: Self-pay

## 2020-09-24 MED FILL — Fluconazole Tab 150 MG: ORAL | 1 days supply | Qty: 1 | Fill #0 | Status: AC

## 2020-11-04 ENCOUNTER — Other Ambulatory Visit (HOSPITAL_COMMUNITY)
Admission: RE | Admit: 2020-11-04 | Discharge: 2020-11-04 | Disposition: A | Discharge status: Home / Self Care | Payer: 59 | Source: Ambulatory Visit | Attending: Obstetrics and Gynecology | Admitting: Obstetrics and Gynecology

## 2020-11-04 ENCOUNTER — Other Ambulatory Visit: Payer: Self-pay

## 2020-11-04 ENCOUNTER — Encounter: Payer: Self-pay | Admitting: Obstetrics and Gynecology

## 2020-11-04 ENCOUNTER — Ambulatory Visit (INDEPENDENT_AMBULATORY_CARE_PROVIDER_SITE_OTHER): Payer: 59 | Admitting: Obstetrics and Gynecology

## 2020-11-04 VITALS — BP 132/99 | HR 78 | Wt 200.0 lb

## 2020-11-04 DIAGNOSIS — Z01419 Encounter for gynecological examination (general) (routine) without abnormal findings: Secondary | ICD-10-CM | POA: Insufficient documentation

## 2020-11-04 DIAGNOSIS — R87612 Low grade squamous intraepithelial lesion on cytologic smear of cervix (LGSIL): Secondary | ICD-10-CM | POA: Diagnosis not present

## 2020-11-04 DIAGNOSIS — N946 Dysmenorrhea, unspecified: Secondary | ICD-10-CM

## 2020-11-04 HISTORY — DX: Dysmenorrhea, unspecified: N94.6

## 2020-11-04 MED ORDER — TRAMADOL HCL 50 MG PO TABS: 50.0000 mg | ORAL_TABLET | Freq: Four times a day (QID) | ORAL | 0 refills | Status: DC | PRN

## 2020-11-04 MED ORDER — IBUPROFEN 800 MG PO TABS: 800.0000 mg | ORAL_TABLET | Freq: Four times a day (QID) | ORAL | 2 refills | Status: DC | PRN

## 2020-11-04 MED ORDER — MEDROXYPROGESTERONE ACETATE 10 MG PO TABS: 10.0000 mg | ORAL_TABLET | Freq: Every day | ORAL | 2 refills | Status: DC

## 2020-11-04 NOTE — Progress Notes (Signed)
GYNECOLOGY ANNUAL PREVENTATIVE CARE ENCOUNTER NOTE  History:     Kristine Johnson is a 35 y.o. No obstetric history on file. female here for a routine annual gynecologic exam.  Current complaints: heavy, painful menstrual cycles. Desires pregnancy, declines BC for that reason.   Denies discharge, pelvic pain, problems with intercourse or other gynecologic concerns.   Continues to have heavy, painful periods. Uses ibuprofen and tramadol sparingly which helps pain.    Gynecologic History Patient's last menstrual period was 10/25/2020. Contraception: none- desires pregnancy in future.  Last Pap: 2018. Results were: normal with negative HPV Last mammogram: NA  Obstetric History OB History  No obstetric history on file.    Past Medical History:  Diagnosis Date  . Migraine     History reviewed. No pertinent surgical history.  Current Outpatient Medications on File Prior to Visit  Medication Sig Dispense Refill  . albuterol (VENTOLIN HFA) 108 (90 Base) MCG/ACT inhaler Inhale 1-2 puffs into the lungs every 6 (six) hours as needed for shortness of breath or wheezing.    . diphenhydrAMINE (BENADRYL) 25 MG tablet Take 25 mg by mouth every 6 (six) hours as needed for itching or allergies.    Marland Kitchen EPINEPHrine 0.3 mg/0.3 mL IJ SOAJ injection Inject 0.3 mLs (0.3 mg total) into the muscle as needed for anaphylaxis. 1 each 0  . topiramate (TOPAMAX) 25 MG tablet Take 25 mg by mouth 2 (two) times daily.    . traMADol (ULTRAM) 50 MG tablet Take 1-2 tablets (50-100 mg total) by mouth every 6 (six) hours as needed for severe pain. 30 tablet 0  . benzonatate (TESSALON) 100 MG capsule Take 100 mg by mouth 3 (three) times daily as needed.    . fluconazole (DIFLUCAN) 150 MG tablet TAKE 1 TABLET BY MOUTH TODAY FOR TREATMENT OF YEAST VAGINITIS 1 tablet 2  . fluticasone (FLONASE) 50 MCG/ACT nasal spray Place 2 sprays into both nostrils daily for 10 days. (Patient not taking: Reported on 09/03/2019)  16 g 0  . fluticasone (FLONASE) 50 MCG/ACT nasal spray Place 2 sprays into both nostrils daily for 10 days. 16 g 0  . ibuprofen (ADVIL) 800 MG tablet Take 1 tablet (800 mg total) by mouth every 6 (six) hours as needed. (Patient not taking: Reported on 11/04/2020) 30 tablet 2  . metroNIDAZOLE (FLAGYL) 500 MG tablet Take 1 tablet (500 mg total) by mouth 2 (two) times daily. (Patient not taking: No sig reported) 14 tablet 0  . montelukast (SINGULAIR) 10 MG tablet Take 1 tablet (10 mg total) by mouth at bedtime. (Patient not taking: Reported on 09/03/2019) 30 tablet 0  . ondansetron (ZOFRAN ODT) 8 MG disintegrating tablet Take 1 tablet (8 mg total) by mouth every 8 (eight) hours as needed for nausea or vomiting. (Patient not taking: Reported on 11/04/2020) 20 tablet 1  . predniSONE (DELTASONE) 1 MG tablet Take 50 tablets (50 mg total) by mouth daily. Take 1 tablet (50 mg total) every day for the next 4 days starting tomorrow (Patient not taking: Reported on 11/04/2020) 4 tablet 0   No current facility-administered medications on file prior to visit.    No Known Allergies  Social History:  reports that she has never smoked. She has never used smokeless tobacco. She reports current alcohol use. She reports previous drug use.  History reviewed. No pertinent family history.  The following portions of the patient's history were reviewed and updated as appropriate: allergies, current medications, past family history, past medical  history, past social history, past surgical history and problem list.  Review of Systems Pertinent items noted in HPI and remainder of comprehensive ROS otherwise negative.  Physical Exam:  BP (!) 132/99   Pulse 78   Wt 200 lb (90.7 kg)   LMP 10/25/2020  CONSTITUTIONAL: Well-developed, well-nourished female in no acute distress.  HENT:  Normocephalic, atraumatic, External right and left ear normal.  EYES: Conjunctivae and EOM are normal. Pupils are equal, round, and reactive  to light. No scleral icterus.  NECK: Normal range of motion, supple, no masses.  Normal thyroid.  SKIN: Skin is warm and dry. No rash noted. Not diaphoretic. No erythema. No pallor. MUSCULOSKELETAL: Normal range of motion. No tenderness.  No cyanosis, clubbing, or edema. NEUROLOGIC: Alert and oriented to person, place, and time. Normal reflexes, muscle tone coordination.  PSYCHIATRIC: Normal mood and affect. Normal behavior. Normal judgment and thought content. CARDIOVASCULAR: Normal heart rate noted, regular rhythm RESPIRATORY: Clear to auscultation bilaterally. Effort and breath sounds normal, no problems with respiration noted. BREASTS: Symmetric in size. No masses, tenderness, skin changes, nipple drainage, or lymphadenopathy bilaterally. Performed in the presence of a chaperone. ABDOMEN: Soft, no distention noted.  No tenderness, rebound or guarding.  PELVIC: Normal appearing external genitalia and urethral meatus; normal appearing vaginal mucosa and cervix.  No abnormal discharge noted.  Pap smear obtained; difficult exam d/t constipation. Moderate stool palpated.  Normal uterine size, no other palpable masses, no uterine or adnexal tenderness.  Performed in the presence of a chaperone.   Assessment and Plan:    1. Painful menstrual periods - Cytology - PAP( [place]) - Cervicovaginal ancillary only( [place]) - HIV antibody (with reflex) - RPR - Hepatitis C Antibody - Rx: Refill on tramadol, and Ibuprofen. She uses this only during painful periods.  - Rx: Provera to use during heavy/painful cycle.   2. Women's annual routine gynecological examination  - Cytology - PAP( [place]) - Cervicovaginal ancillary only( [place]) - HIV antibody (with reflex) - RPR - Hepatitis C Antibody  Will follow up results of pap smear and manage accordingly. Routine preventative health maintenance measures emphasized. Please refer to After Visit Summary for other counseling  recommendations.      Evangaline Jou, Artist Pais, [place] for [employer], [place]

## 2020-11-05 ENCOUNTER — Ambulatory Visit: Payer: 59 | Attending: Critical Care Medicine

## 2020-11-05 DIAGNOSIS — Z20822 Contact with and (suspected) exposure to covid-19: Secondary | ICD-10-CM

## 2020-11-05 LAB — CERVICOVAGINAL ANCILLARY ONLY
Bacterial Vaginitis (gardnerella): NEGATIVE
Candida Glabrata: NEGATIVE
Candida Vaginitis: NEGATIVE
Chlamydia: NEGATIVE
Comment: NEGATIVE
Comment: NEGATIVE
Comment: NEGATIVE
Comment: NEGATIVE
Comment: NEGATIVE
Comment: NORMAL
Neisseria Gonorrhea: NEGATIVE
Trichomonas: NEGATIVE

## 2020-11-05 LAB — HIV ANTIBODY (ROUTINE TESTING W REFLEX): HIV Screen 4th Generation wRfx: NONREACTIVE

## 2020-11-05 LAB — RPR: RPR Ser Ql: NONREACTIVE

## 2020-11-05 LAB — HEPATITIS C ANTIBODY: Hep C Virus Ab: <0.1 s/co ratio (ref 0.0–0.9)

## 2020-11-06 LAB — SARS-COV-2, NAA 2 DAY TAT

## 2020-11-06 LAB — NOVEL CORONAVIRUS, NAA: SARS-CoV-2, NAA: NOT DETECTED

## 2020-11-11 LAB — CYTOLOGY - PAP
Comment: NEGATIVE
Comment: NEGATIVE
HPV 16: NEGATIVE
HPV 18 / 45: NEGATIVE
High risk HPV: POSITIVE — AB

## 2020-11-12 ENCOUNTER — Telehealth: Payer: Self-pay | Admitting: Obstetrics and Gynecology

## 2020-11-12 NOTE — Telephone Encounter (Signed)
Called [facility] and discussed pap results. Colpo recommended. Will send message to the office to schedule.   Noni Saupe I, NP 11/12/2020 9:02 AM

## 2020-12-04 ENCOUNTER — Telehealth: Payer: Self-pay | Admitting: Lactation Services

## 2020-12-04 NOTE — Telephone Encounter (Signed)
Received Ibuprofen Refill request. Per chart review patient was prescribed Ibuprofen 800 mg to take as needed for pain.   Per Pharmacy has prescription refilled x 3  within the last month.   Called patient and she reports that they pharmacy filled all her prescriptions at one tome. Patient reports only taking with cycles and with pain. She has not taken any of the most recent prescriptions but has all 3 refills at home.  They also filled more than 1 refill for Provera at that time.   Patient reports she does not need refills on the Provera and Ibuprofen at this time, she will call as needed.

## 2020-12-19 ENCOUNTER — Other Ambulatory Visit (HOSPITAL_COMMUNITY)
Admission: RE | Admit: 2020-12-19 | Discharge: 2020-12-19 | Disposition: A | Discharge status: Home / Self Care | Payer: 59 | Source: Ambulatory Visit | Attending: Obstetrics and Gynecology | Admitting: Obstetrics and Gynecology

## 2020-12-19 ENCOUNTER — Ambulatory Visit (INDEPENDENT_AMBULATORY_CARE_PROVIDER_SITE_OTHER): Payer: 59 | Admitting: Obstetrics and Gynecology

## 2020-12-19 ENCOUNTER — Other Ambulatory Visit: Payer: Self-pay

## 2020-12-19 ENCOUNTER — Encounter: Payer: Self-pay | Admitting: Obstetrics and Gynecology

## 2020-12-19 DIAGNOSIS — Z3202 Encounter for pregnancy test, result negative: Secondary | ICD-10-CM | POA: Diagnosis not present

## 2020-12-19 DIAGNOSIS — R87612 Low grade squamous intraepithelial lesion on cytologic smear of cervix (LGSIL): Secondary | ICD-10-CM | POA: Insufficient documentation

## 2020-12-19 DIAGNOSIS — N870 Mild cervical dysplasia: Secondary | ICD-10-CM | POA: Diagnosis not present

## 2020-12-19 LAB — POCT PREGNANCY, URINE: Preg Test, Ur: NEGATIVE

## 2020-12-19 NOTE — Progress Notes (Signed)
35 yo G1P0 seen for colposcopy.  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  No available previous pap history.  Patient given informed consent, signed copy in the chart, time out was performed.  UPT was negative.   Placed in lithotomy position. Cervix viewed with speculum and colposcope after application of acetic acid.   Colposcopy adequate?  yes Acetowhite lesions?no visible acetowhite lesions, lesion noted with application of Lugol's solution, noted at 2-3 oclock Punctation? no Mosaicism?   no Abnormal vasculature?  no Biopsies?yes at 2 o'clock ECC? no  COMMENTS: Patient was given post procedure instructions.  Future treatment plan will be set after pathology is reviewed.  Griffin Basil, MD

## 2020-12-24 LAB — SURGICAL PATHOLOGY

## 2020-12-30 ENCOUNTER — Other Ambulatory Visit: Payer: Self-pay

## 2020-12-30 ENCOUNTER — Ambulatory Visit (INDEPENDENT_AMBULATORY_CARE_PROVIDER_SITE_OTHER): Payer: 59 | Admitting: Obstetrics and Gynecology

## 2020-12-30 VITALS — BP 128/88 | Ht 62.0 in | Wt 201.3 lb

## 2020-12-30 DIAGNOSIS — R87612 Low grade squamous intraepithelial lesion on cytologic smear of cervix (LGSIL): Secondary | ICD-10-CM | POA: Diagnosis not present

## 2020-12-30 DIAGNOSIS — N946 Dysmenorrhea, unspecified: Secondary | ICD-10-CM

## 2020-12-30 NOTE — Progress Notes (Signed)
  CC:  colposcopy follow up Subjective:    Patient ID: Kristine Johnson, female    DOB: 1985-08-03, 35 y.o.   MRN: [id number]  HPI Pt returns for colposcopy follow up.  Pt had LGSIL on pap which was confirmed on colpo biopsy.  Pt advised her immune system will hopefully clear the HPV and allow the lesions to heal.  She will need a repap in 1 year.  Pt also discussed heavy bleeding from a previous visit.  The pt is taking po provera 2-3 days into her cycle to cut down on bleeding.  This has been moderately effective, but she still has clots.  Pt is considering pregnancy in the next few years so long term meds like mirena are not an option.  Did discuss lysteda as a possible option.  U/S from 2020 showed normal sized uterus and no fibroids.  If bleeding is persistent she may need a more up to date scan.   Review of Systems     Objective:   Physical Exam Vitals:   12/30/20 1554  BP: 128/88         Assessment & Plan:   1. Low grade squamous intraepithelial lesion (LGSIL) on cervical Pap smear Repap in 1 year at next Annual exam.  If there is a higher level lesion she may need more aggressive treatment.  2. Painful menstrual periods Continue with provera for now.  Pt given information on lysteda as a non hormonal alternative.  F/u 10/2021 or prn.  I spent 10 minutes dedicated to the care of this patient including previsit review of records, face to face time with the patient discussing pathology findings, plan of care and post visit testing.     Griffin Basil, MD Faculty Attending, [facility]

## 2021-01-14 DIAGNOSIS — Z20822 Contact with and (suspected) exposure to covid-19: Secondary | ICD-10-CM | POA: Diagnosis not present

## 2021-01-18 DIAGNOSIS — Z20822 Contact with and (suspected) exposure to covid-19: Secondary | ICD-10-CM | POA: Diagnosis not present

## 2021-02-21 ENCOUNTER — Emergency Department (HOSPITAL_COMMUNITY)
Admission: EM | Admit: 2021-02-21 | Discharge: 2021-02-21 | Disposition: A | Discharge status: Home / Self Care | Payer: 59 | Attending: Emergency Medicine | Admitting: Emergency Medicine

## 2021-02-21 ENCOUNTER — Emergency Department (HOSPITAL_COMMUNITY): Payer: 59

## 2021-02-21 ENCOUNTER — Other Ambulatory Visit: Payer: Self-pay

## 2021-02-21 ENCOUNTER — Encounter (HOSPITAL_COMMUNITY): Payer: Self-pay

## 2021-02-21 DIAGNOSIS — R509 Fever, unspecified: Secondary | ICD-10-CM

## 2021-02-21 DIAGNOSIS — J45909 Unspecified asthma, uncomplicated: Secondary | ICD-10-CM | POA: Diagnosis not present

## 2021-02-21 DIAGNOSIS — J029 Acute pharyngitis, unspecified: Secondary | ICD-10-CM | POA: Diagnosis present

## 2021-02-21 DIAGNOSIS — U071 COVID-19: Secondary | ICD-10-CM | POA: Diagnosis not present

## 2021-02-21 DIAGNOSIS — R000 Tachycardia, unspecified: Secondary | ICD-10-CM | POA: Insufficient documentation

## 2021-02-21 DIAGNOSIS — J069 Acute upper respiratory infection, unspecified: Secondary | ICD-10-CM | POA: Diagnosis not present

## 2021-02-21 HISTORY — DX: Unspecified asthma, uncomplicated: J45.909

## 2021-02-21 LAB — I-STAT BETA HCG BLOOD, ED (MC, WL, AP ONLY): I-stat hCG, quantitative: <5 m[IU]/mL (ref <5)

## 2021-02-21 LAB — CBC WITH DIFFERENTIAL/PLATELET
Abs Immature Granulocytes: 0.03 10*3/uL (ref 0.00–0.07)
Basophils Absolute: 0 10*3/uL (ref 0.0–0.1)
Basophils Relative: 0 %
Eosinophils Absolute: 0 10*3/uL (ref 0.0–0.5)
Eosinophils Relative: 0 %
HCT: 31.8 % — ABNORMAL LOW (ref 36.0–46.0)
Hemoglobin: 9.2 g/dL — ABNORMAL LOW (ref 12.0–15.0)
Immature Granulocytes: 0 %
Lymphocytes Relative: 7 %
Lymphs Abs: 0.5 10*3/uL — ABNORMAL LOW (ref 0.7–4.0)
MCH: 21.5 pg — ABNORMAL LOW (ref 26.0–34.0)
MCHC: 28.9 g/dL — ABNORMAL LOW (ref 30.0–36.0)
MCV: 74.3 fL — ABNORMAL LOW (ref 80.0–100.0)
Monocytes Absolute: 0.7 10*3/uL (ref 0.1–1.0)
Monocytes Relative: 10 %
Neutro Abs: 5.9 10*3/uL (ref 1.7–7.7)
Neutrophils Relative %: 83 %
Platelets: 315 10*3/uL (ref 150–400)
RBC: 4.28 MIL/uL (ref 3.87–5.11)
RDW: 17.9 % — ABNORMAL HIGH (ref 11.5–15.5)
WBC: 7.2 10*3/uL (ref 4.0–10.5)
nRBC: 0 % (ref 0.0–0.2)

## 2021-02-21 LAB — URINALYSIS, ROUTINE W REFLEX MICROSCOPIC
Bilirubin Urine: NEGATIVE
Glucose, UA: NEGATIVE mg/dL
Ketones, ur: 5 mg/dL — AB
Nitrite: NEGATIVE
Protein, ur: 100 mg/dL — AB
RBC / HPF: >50 RBC/hpf — ABNORMAL HIGH (ref 0–5)
Specific Gravity, Urine: 1.021 (ref 1.005–1.030)
pH: 8 (ref 5.0–8.0)

## 2021-02-21 LAB — COMPREHENSIVE METABOLIC PANEL
ALT: 14 U/L (ref 0–44)
AST: 19 U/L (ref 15–41)
Albumin: 3.3 g/dL — ABNORMAL LOW (ref 3.5–5.0)
Alkaline Phosphatase: 70 U/L (ref 38–126)
Anion gap: 8 (ref 5–15)
BUN: 11 mg/dL (ref 6–20)
CO2: 22 mmol/L (ref 22–32)
Calcium: 8.4 mg/dL — ABNORMAL LOW (ref 8.9–10.3)
Chloride: 105 mmol/L (ref 98–111)
Creatinine, Ser: 0.82 mg/dL (ref 0.44–1.00)
GFR, Estimated: >60 mL/min (ref >60)
Glucose, Bld: 102 mg/dL — ABNORMAL HIGH (ref 70–99)
Potassium: 3.7 mmol/L (ref 3.5–5.1)
Sodium: 135 mmol/L (ref 135–145)
Total Bilirubin: 0.6 mg/dL (ref 0.3–1.2)
Total Protein: 7 g/dL (ref 6.5–8.1)

## 2021-02-21 LAB — RESP PANEL BY RT-PCR (FLU A&B, COVID) ARPGX2
Influenza A by PCR: NEGATIVE
Influenza B by PCR: NEGATIVE
SARS Coronavirus 2 by RT PCR: POSITIVE — AB

## 2021-02-21 LAB — GROUP A STREP BY PCR: Group A Strep by PCR: NOT DETECTED

## 2021-02-21 LAB — POC SARS CORONAVIRUS 2 AG -  ED: SARSCOV2ONAVIRUS 2 AG: NEGATIVE

## 2021-02-21 MED ORDER — SODIUM CHLORIDE 0.9 % IV BOLUS
1000.0000 mL | Freq: Once | INTRAVENOUS | Status: AC
  Administered 2021-02-21: 1000 mL via INTRAVENOUS

## 2021-02-21 MED ORDER — IBUPROFEN 800 MG PO TABS
800.0000 mg | ORAL_TABLET | Freq: Once | ORAL | Status: AC
  Administered 2021-02-21: 800 mg via ORAL
  Filled 2021-02-21: qty 1

## 2021-02-21 MED ORDER — ACETAMINOPHEN 325 MG PO TABS
650.0000 mg | ORAL_TABLET | Freq: Once | ORAL | Status: AC
  Administered 2021-02-21: 650 mg via ORAL
  Filled 2021-02-21: qty 2

## 2021-02-21 NOTE — ED Provider Notes (Signed)
[facility] EMERGENCY DEPARTMENT Provider Note   CSN: [id number] Arrival date & time: 02/21/21  1123     History Chief Complaint  Patient presents with  . Shortness of Breath  . Sore Throat  . Fever    Kristine Johnson is a 35 y.o. female.  35 year old female with history of asthma presents with complaint of feeling unwell today.  Patient states that she noticed a sore throat last night, hurts to talk and swallow.  Woke up today with body aches, shortness of breath, left upper quadrant abdominal pain.  She denies changes in bowel or bladder habits, denies possibility of pregnancy and is currently on her menstrual cycle.  No known sick contacts, specifically no known exposure to [place], is vaccinated against COVID.  No other complaints or concerns today.  Kristine Johnson was evaluated in Emergency Department on 02/21/2021 for the symptoms described in the history of present illness. She was evaluated in the context of the global COVID-19 pandemic, which necessitated consideration that the patient might be at risk for infection with the SARS-CoV-2 virus that causes COVID-19. Institutional protocols and algorithms that pertain to the evaluation of patients at risk for COVID-19 are in a state of rapid change based on information released by regulatory bodies including the CDC and federal and state organizations. These policies and algorithms were followed during the patient's care in the ED.       Past Medical History:  Diagnosis Date  . Asthma   . Migraine   . Vaginal Pap smear, abnormal     Patient Active Problem List   Diagnosis Date Noted  . Low grade squamous intraepithelial lesion (LGSIL) on cervical Pap smear 12/19/2020  . Painful menstrual periods 11/04/2020  . Women's annual routine gynecological examination 11/04/2020    Past Surgical History:  Procedure Laterality Date  . TONSILLECTOMY  2014  . WISDOM TOOTH EXTRACTION       OB  History     Gravida  1   Para      Term      Preterm      AB  1   Living         SAB  1   IAB      Ectopic      Multiple      Live Births              No family history on file.  Social History   Tobacco Use  . Smoking status: Never  . Smokeless tobacco: Never  Substance Use Topics  . Alcohol use: Yes    Comment: socially   . Drug use: Not Currently    Home Medications Prior to Admission medications   Medication Sig Start Date End Date Taking? Authorizing Provider  albuterol (VENTOLIN HFA) 108 (90 Base) MCG/ACT inhaler Inhale 1-2 puffs into the lungs every 6 (six) hours as needed for shortness of breath or wheezing. 07/11/19  Yes [provider]  EPINEPHrine 0.3 mg/0.3 mL IJ SOAJ injection Inject 0.3 mLs (0.3 mg total) into the muscle as needed for anaphylaxis. 09/04/19  Yes Tegeler, Gwenyth Allegra, MD  ibuprofen (ADVIL) 800 MG tablet Take 1 tablet (800 mg total) by mouth every 6 (six) hours as needed. Patient taking differently: Take 800 mg by mouth every 6 (six) hours as needed for mild pain. 11/04/20  Yes Rasch, Anderson Malta I, NP  medroxyPROGESTERone (PROVERA) 10 MG tablet Take 1 tablet (10 mg total) by mouth daily. Take as  need for heavy menstrual cycle Patient taking differently: Take 10 mg by mouth daily as needed (heavy menstrual cycle). 11/04/20  Yes Rasch, Artist Pais, NP  traMADol (ULTRAM) 50 MG tablet Take 1-2 tablets (50-100 mg total) by mouth every 6 (six) hours as needed for severe pain. 11/04/20  Yes Rasch, Anderson Malta I, NP  topiramate (TOPAMAX) 25 MG tablet Take 25 mg by mouth 2 (two) times daily. Patient not taking: Reported on 02/21/2021 07/27/19   [provider]    Allergies    Covid-19 ad26 vaccine(janssen)  Review of Systems   Review of Systems  Constitutional:  Positive for fever.  HENT:  Positive for congestion and sore throat. Negative for trouble swallowing and voice change.   Eyes:  Negative for redness.  Respiratory:   Positive for shortness of breath. Negative for cough.   Cardiovascular:  Negative for chest pain.  Gastrointestinal:  Positive for abdominal pain. Negative for constipation, diarrhea, nausea and vomiting.  Genitourinary:  Negative for dysuria.  Musculoskeletal:  Positive for arthralgias and myalgias.  Skin:  Negative for rash and wound.  Allergic/Immunologic: Negative for immunocompromised state.  Neurological:  Negative for weakness and headaches.  Hematological:  Negative for adenopathy.  Psychiatric/Behavioral:  Negative for confusion.   All other systems reviewed and are negative.  Physical Exam Updated Vital Signs BP 118/70 (BP Location: Right Arm)   Pulse (!) 109   Temp 98.8 F (37.1 C) (Oral)   Resp 18   Ht '5\' 2"'$  (1.575 m)   Wt 96.2 kg   LMP 02/20/2021   SpO2 100%   BMI 38.78 kg/m   Physical Exam Vitals and nursing note reviewed.  Constitutional:      General: She is not in acute distress.    Appearance: She is well-developed. She is not diaphoretic.  HENT:     Head: Normocephalic and atraumatic.     Mouth/Throat:     Mouth: Mucous membranes are moist.     Pharynx: No oropharyngeal exudate.  Eyes:     Pupils: Pupils are equal, round, and reactive to light.  Cardiovascular:     Rate and Rhythm: Regular rhythm. Tachycardia present.  Pulmonary:     Effort: Pulmonary effort is normal.     Breath sounds: Normal breath sounds. No decreased breath sounds.  Abdominal:     Palpations: Abdomen is soft.     Tenderness: There is generalized abdominal tenderness. There is no guarding or rebound.     Comments: Mild generalized tenderness  Musculoskeletal:     Cervical back: Neck supple.     Right lower leg: No edema.     Left lower leg: No edema.  Lymphadenopathy:     Cervical: No cervical adenopathy.  Skin:    General: Skin is warm and dry.  Neurological:     Mental Status: She is alert and oriented to person, place, and time.  Psychiatric:        Behavior:  Behavior normal.    ED Results / Procedures / Treatments   Labs (all labs ordered are listed, but only abnormal results are displayed) Labs Reviewed  RESP PANEL BY RT-PCR (FLU A&B, COVID) ARPGX2 - Abnormal; Notable for the following components:      Result Value   SARS Coronavirus 2 by RT PCR POSITIVE (*)    All other components within normal limits  COMPREHENSIVE METABOLIC PANEL - Abnormal; Notable for the following components:   Glucose, Bld 102 (*)    Calcium 8.4 (*)  Albumin 3.3 (*)    All other components within normal limits  CBC WITH DIFFERENTIAL/PLATELET - Abnormal; Notable for the following components:   Hemoglobin 9.2 (*)    HCT 31.8 (*)    MCV 74.3 (*)    MCH 21.5 (*)    MCHC 28.9 (*)    RDW 17.9 (*)    Lymphs Abs 0.5 (*)    All other components within normal limits  URINALYSIS, ROUTINE W REFLEX MICROSCOPIC - Abnormal; Notable for the following components:   APPearance CLOUDY (*)    Hgb urine dipstick MODERATE (*)    Ketones, ur 5 (*)    Protein, ur 100 (*)    Leukocytes,Ua SMALL (*)    RBC / HPF >50 (*)    Bacteria, UA FEW (*)    Non Squamous Epithelial 0-5 (*)    All other components within normal limits  GROUP A STREP BY PCR  I-STAT BETA HCG BLOOD, ED ([facility], [facility], [facility] ONLY)  POC SARS CORONAVIRUS 2 AG -  ED    EKG EKG Interpretation  Date/Time:  Friday February 21 2021 11:32:26 EDT Ventricular Rate:  116 PR Interval:  134 QRS Duration: 66 QT Interval:  314 QTC Calculation: 436 R Axis:   70 Text Interpretation: Sinus tachycardia Nonspecific T wave abnormality Abnormal ECG Confirmed by Octaviano Glow [phone]) on 02/21/2021 1:40:59 PM  Radiology DG Chest 1 View  Result Date: 02/21/2021 CLINICAL DATA:  Chest pain, shortness of breath and fever. EXAM: CHEST  1 VIEW COMPARISON:  07/18/2019 FINDINGS: Normal cardiomediastinal contours. No pleural effusion or edema identified. No airspace densities. IMPRESSION: No acute cardiopulmonary abnormalities.  Electronically Signed   By: Kerby Moors M.D.   On: 02/21/2021 12:46    Procedures Procedures   Medications Ordered in ED Medications  acetaminophen (TYLENOL) tablet 650 mg (650 mg Oral Given 02/21/21 1236)  sodium chloride 0.9 % bolus 1,000 mL (0 mLs Intravenous Stopped 02/21/21 1632)  ibuprofen (ADVIL) tablet 800 mg (800 mg Oral Given 02/21/21 1448)    ED Course  I have reviewed the triage vital signs and the nursing notes.  Pertinent labs & imaging results that were available during my care of the patient were reviewed by me and considered in my medical decision making (see chart for details).  Clinical Course as of 02/21/21 1715  Fri Feb 21, 2021  1340 SARS Coronavirus 2 by RT PCR(!): POSITIVE [MT]  4159 35 year old female presents with complaint of sore throat and feeling unwell as above.  Febrile on arrival with temperature of 102.7.  Patient was given Tylenol, labs obtained to further evaluate.  CBC with anemia with hemoglobin of 9.2, previously 11.1, currently on her menstrual cycle otherwise normal white blood cell count.  CMP without significant findings, hCG negative.  Patient is COVID-positive, flu negative on PCR however upon discussion of results, states that she had COVID at the end of July, her positive test today may be residual from prior infection.  Rapid strep pending. [LM]  1713 Rapid COVID-negative, suspect her PCR is positive from her infection in July. Patient was given IV fluids, Motrin, Tylenol.  Fever has improved to 99.9 on last check.  Heart rate has improved, currently 109.  O2 sat reassuring at 100% on room air. Patient is feeling better at this time. Suspect viral illness, return to ER for worsening or concerning symptoms otherwise recheck with primary care provider as needed. [LM]    Clinical Course User Index [LM] Tacy Learn, PA-C [MT] Langston Masker,  Carola Rhine, MD   MDM Rules/Calculators/[name]                           Final Clinical Impression(s) / ED  Diagnoses Final diagnoses:  Viral upper respiratory tract infection  Fever, unspecified fever cause    Rx / DC Orders ED Discharge Orders     None        Tacy Learn, PA-C 02/21/21 1715    Wyvonnia Dusky, MD 02/21/21 [phone]

## 2021-02-21 NOTE — ED Triage Notes (Signed)
Pt reports sore throat that started last night long with sob,fever and intermittent LUQ pain that started this morning. Nausea but no vomiting, febrile in triage

## 2021-02-21 NOTE — Discharge Instructions (Signed)
Motrin and Tylenol as needed as directed.  Home to rest, push hydrating fluids.  Return to the emergency room for any worsening or concerning symptoms otherwise follow-up with your primary care provider if fever persists.

## 2021-04-23 ENCOUNTER — Telehealth: Payer: 59 | Admitting: Physician Assistant

## 2021-04-23 DIAGNOSIS — J4531 Mild persistent asthma with (acute) exacerbation: Secondary | ICD-10-CM

## 2021-04-23 MED ORDER — PREDNISONE 20 MG PO TABS: 40.0000 mg | ORAL_TABLET | Freq: Every day | ORAL | 0 refills | Status: DC

## 2021-04-23 MED ORDER — ALBUTEROL SULFATE HFA 108 (90 BASE) MCG/ACT IN AERS: 1.0000 | INHALATION_SPRAY | Freq: Four times a day (QID) | RESPIRATORY_TRACT | 0 refills | Status: AC | PRN

## 2021-04-23 NOTE — Progress Notes (Signed)
Visit for Asthma  Based on what you have shared with me, it looks like you may have a flare up of your asthma.  Asthma is a chronic (ongoing) lung disease which results in airway obstruction, inflammation and hyper-responsiveness.   Asthma symptoms vary from person to person, with common symptoms including nighttime awakening and decreased ability to participate in normal activities as a result of shortness of breath. It is often triggered by changes in weather, changes in the season, changes in air temperature, or inside (home, school, daycare or work) allergens such as animal dander, mold, mildew, woodstoves or cockroaches.   It can also be triggered by hormonal changes, extreme emotion, physical exertion or an upper respiratory tract illness.     It is important to identify the trigger, and then eliminate or avoid the trigger if possible.   If you have been prescribed medications to be taken on a regular basis, it is important to follow the asthma action plan and to follow guidelines to adjust medication in response to increasing symptoms of decreased peak expiratory flow rate  Treatment: I have prescribed: Albuterol (Proventil HFA; Ventolin HFA) 108 (90 Base) MCG/ACT Inhaler 2 puffs into the lungs every six hours as needed for wheezing or shortness of breath and Prednisone 40mg  by mouth per day for 5 - 7 days  HOME CARE Only take medications as instructed by your medical team. Consider wearing a mask or scarf to improve breathing air temperature have been shown to decrease irritation and decrease exacerbations Get rest. Taking a steamy shower or using a humidifier may help nasal congestion sand ease sore throat pain. You can place a towel over your head and breathe in the steam from hot water coming from a faucet. Using a saline nasal spray works much the same way.  Cough drops, hare  candies and sore throat lozenges may ease your cough.  Avoid close contacts especially the very you and the elderly Cover your mouth if you cough or sneeze Always remember to wash your hands.    GET HELP RIGHT AWAY IF: You develop worsening symptoms; breathlessness at rest, drowsy, confused or agitated, unable to speak in full sentences You have coughing fits You develop a severe headache or visual changes You develop shortness of breath, difficulty breathing or start having chest pain Your symptoms persist after you have completed your treatment plan If your symptoms do not improve within 10 days  MAKE SURE YOU Understand these instructions. Will watch your condition. Will get help right away if you are not doing well or get worse.   Your e-visit answers were reviewed by a board certified advanced clinical practitioner to complete your personal care plan, Depending upon the condition, your plan could have included both over the counter or prescription medications.   Please review your pharmacy choice. Your safety is important to [place]. If you have drug allergies check your prescription carefully.  You can use MyChart to ask questions about today's visit, request a non-urgent  call back, or ask for a work or school excuse for 24 hours related to this e-Visit. If it has been greater than 24 hours you will need to follow up with your provider, or enter a new e-Visit to address those concerns.   You will get an e-mail in the next two days asking about your experience. I hope that your e-visit has been valuable and will speed your recovery. Thank you for using e-visits.  I provided 5 minutes of non face-to-face time  during this encounter for chart review and documentation.

## 2021-05-18 ENCOUNTER — Other Ambulatory Visit: Payer: Self-pay | Admitting: Physician Assistant

## 2021-05-18 DIAGNOSIS — J4531 Mild persistent asthma with (acute) exacerbation: Secondary | ICD-10-CM

## 2021-08-23 ENCOUNTER — Other Ambulatory Visit: Payer: Self-pay | Admitting: Obstetrics and Gynecology

## 2021-08-23 DIAGNOSIS — N946 Dysmenorrhea, unspecified: Secondary | ICD-10-CM

## 2021-08-23 DIAGNOSIS — Z01419 Encounter for gynecological examination (general) (routine) without abnormal findings: Secondary | ICD-10-CM

## 2021-08-23 MED ORDER — TRAMADOL HCL 50 MG PO TABS: 50.0000 mg | ORAL_TABLET | Freq: Four times a day (QID) | ORAL | 0 refills | Status: DC | PRN

## 2021-08-23 NOTE — Progress Notes (Signed)
Refill request for Tramadol. Patient uses for painful periods.  ?Rx: sent  ? ?Lezlie Lye, NP ?08/23/2021 ?4:04 PM ? ?

## 2021-09-09 IMAGING — DX DG CHEST 1V
1 series · 2 of 2 positions shown · non-contrast
Comparison: 07/18/2019

CLINICAL DATA: Chest pain, shortness of breath and fever.

EXAM:
CHEST  1 VIEW

[Series 1: chest · 0.14mm/px · 2 of 2 slices shown]
[im 1/2]
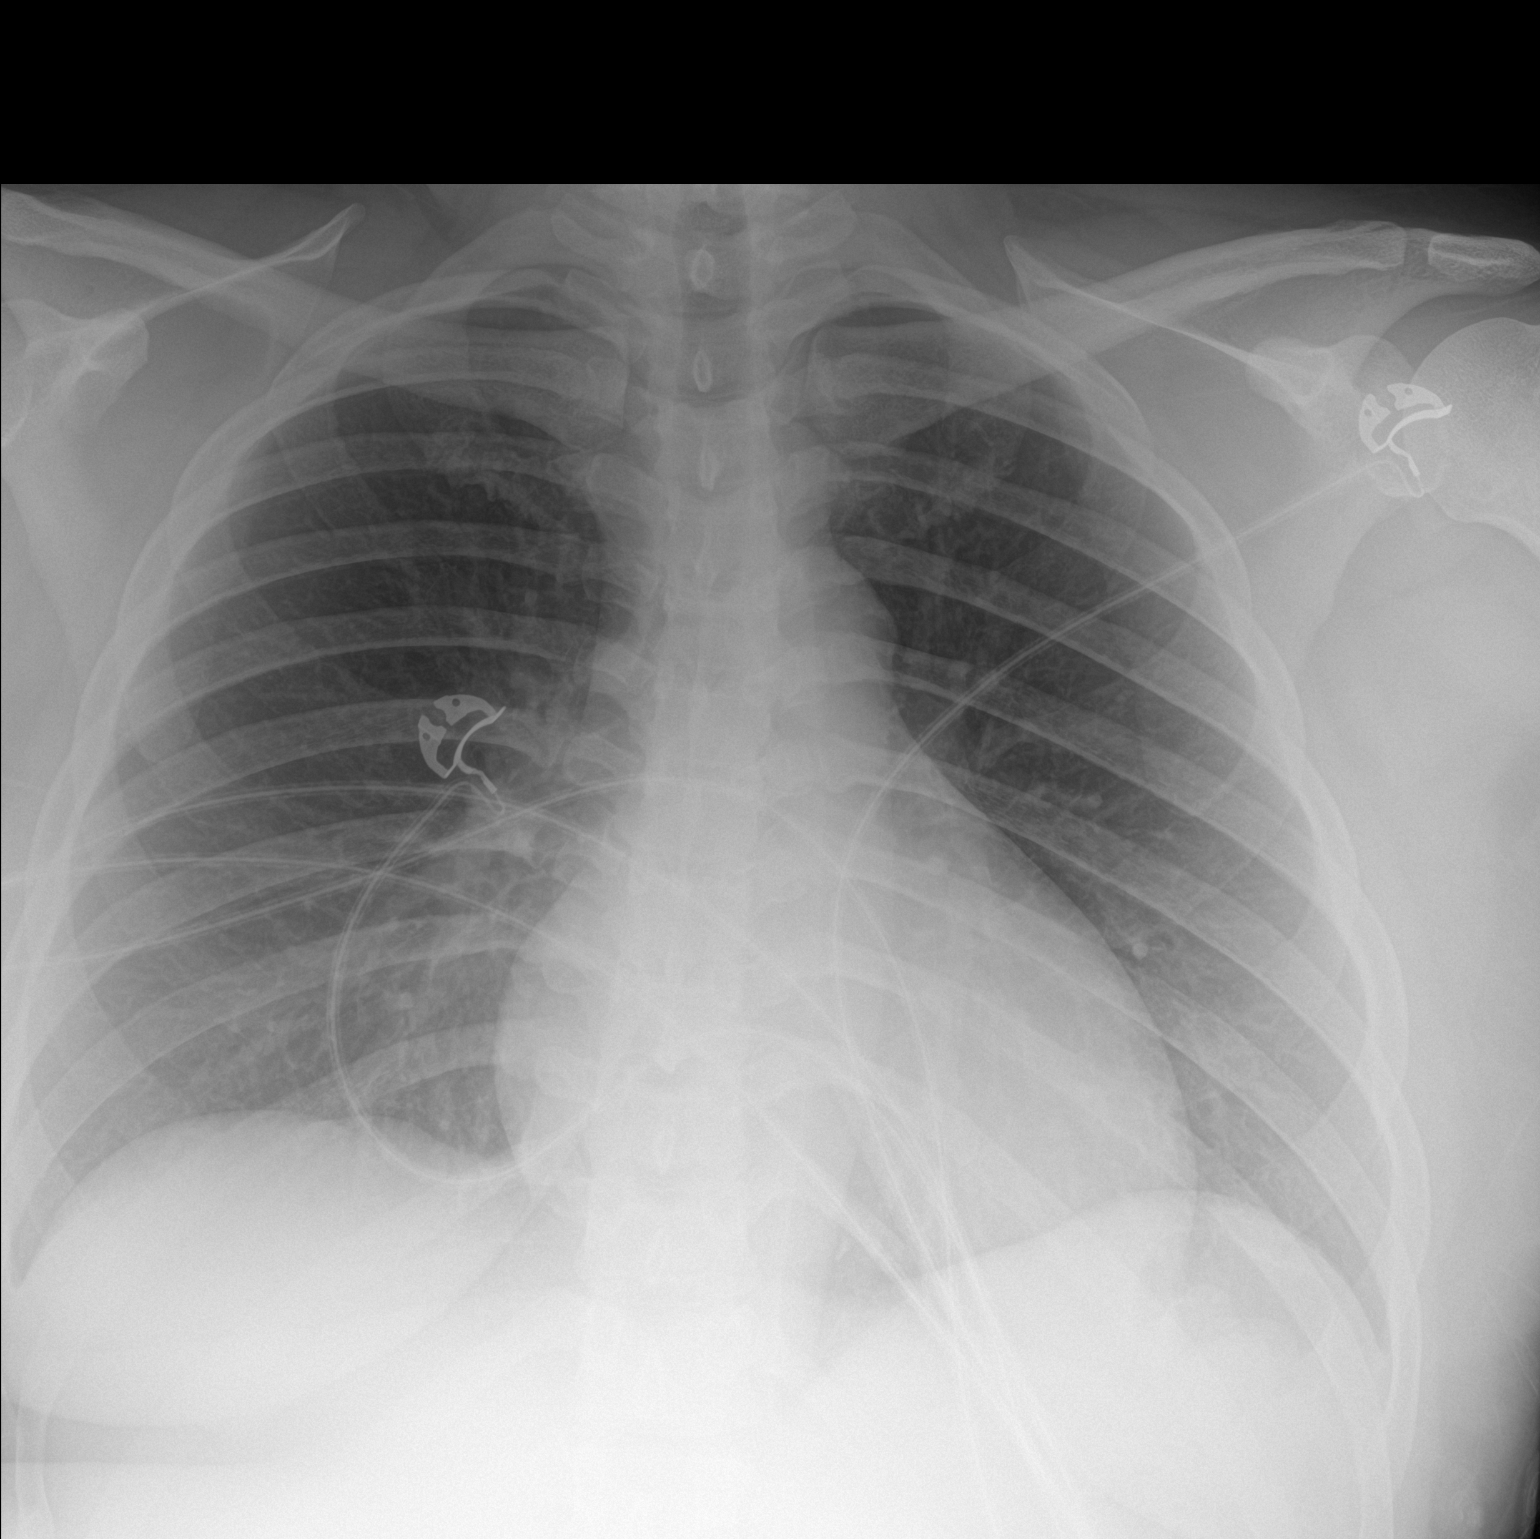
[im 2/2]
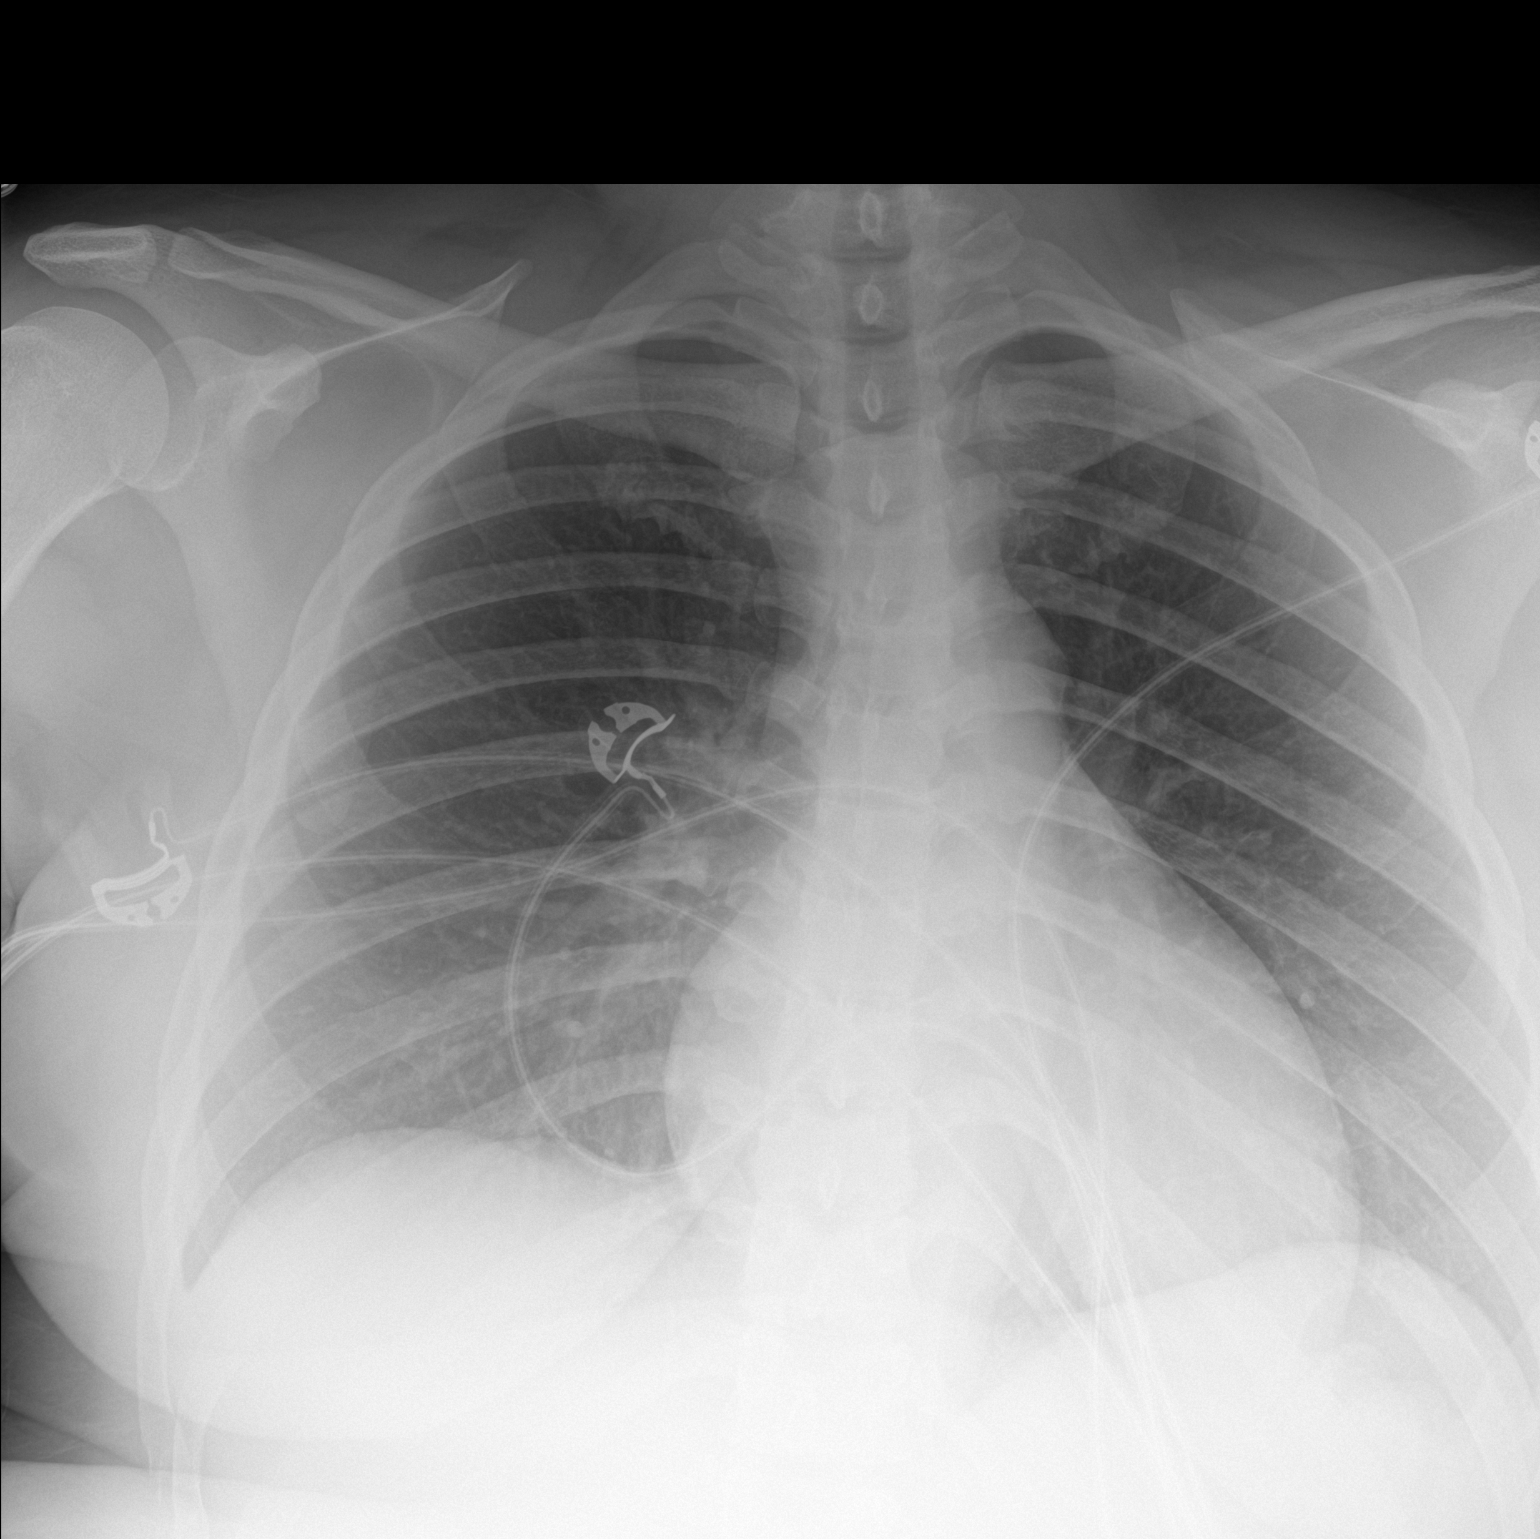

[2 of 2 positions shown; findings below may reference images not displayed]

FINDINGS: Normal cardiomediastinal contours. No pleural effusion or edema
identified. No airspace densities.
IMPRESSION: No acute cardiopulmonary abnormalities.

## 2021-11-13 ENCOUNTER — Encounter: Payer: Self-pay | Admitting: Obstetrics and Gynecology

## 2021-11-13 ENCOUNTER — Ambulatory Visit: Payer: BC Managed Care – PPO | Admitting: Obstetrics and Gynecology

## 2021-11-13 ENCOUNTER — Other Ambulatory Visit (HOSPITAL_COMMUNITY)
Admission: RE | Admit: 2021-11-13 | Discharge: 2021-11-13 | Disposition: A | Discharge status: Home / Self Care | Payer: BC Managed Care – PPO | Source: Ambulatory Visit | Attending: Obstetrics and Gynecology | Admitting: Obstetrics and Gynecology

## 2021-11-13 VITALS — BP 134/89 | HR 77 | Wt 215.1 lb

## 2021-11-13 DIAGNOSIS — R87612 Low grade squamous intraepithelial lesion on cytologic smear of cervix (LGSIL): Secondary | ICD-10-CM | POA: Diagnosis not present

## 2021-11-13 DIAGNOSIS — Z01419 Encounter for gynecological examination (general) (routine) without abnormal findings: Secondary | ICD-10-CM

## 2021-11-13 DIAGNOSIS — N946 Dysmenorrhea, unspecified: Secondary | ICD-10-CM

## 2021-11-13 DIAGNOSIS — Z01411 Encounter for gynecological examination (general) (routine) with abnormal findings: Secondary | ICD-10-CM

## 2021-11-13 NOTE — Progress Notes (Signed)
GYNECOLOGY ANNUAL PREVENTATIVE CARE ENCOUNTER NOTE  History:     Kristine Johnson is a 36 y.o. [id number] female here for a routine annual gynecologic exam.  Current complaints: continued discomfort and bloating at time of menses.   Pt continues to have moderate to heavy bleeding at time of menses as well.. Menses are regular.  Pt desires conception at this time.  Discussed treatment options for heavy painful menses including extended regimen OCP and progesterone IUD; however, neither are compatible with conception..  In that case discussed high dose NSAID at the very beginning of her menses and for the first 36-48 hours.   Gynecologic History Patient's last menstrual period was 10/15/2021 (exact date). Contraception: none Last Pap: 11/04/20. Results were: abnormal with CIN 1 confirmed on colpo.  Obstetric History OB History  Gravida Para Term Preterm AB Living  1       1    SAB IAB Ectopic Multiple Live Births  1            # Outcome Date GA Lbr Len/2nd Weight Sex Delivery Anes PTL Lv  1 SAB             Past Medical History:  Diagnosis Date   Asthma    Migraine    Vaginal Pap smear, abnormal     Past Surgical History:  Procedure Laterality Date   TONSILLECTOMY  2014   WISDOM TOOTH EXTRACTION      Current Outpatient Medications on File Prior to Visit  Medication Sig Dispense Refill   albuterol (VENTOLIN HFA) 108 (90 Base) MCG/ACT inhaler Inhale 1-2 puffs into the lungs every 6 (six) hours as needed for shortness of breath or wheezing. 18 g 0   escitalopram (LEXAPRO) 10 MG tablet Take 10 mg by mouth daily.     Ferrous Gluconate 324 (37.5 Fe) MG TABS Take 1 tablet by mouth daily.     ibuprofen (ADVIL) 800 MG tablet Take 1 tablet (800 mg total) by mouth every 6 (six) hours as needed. (Patient taking differently: Take 800 mg by mouth every 6 (six) hours as needed for mild pain.) 30 tablet 2   topiramate (TOPAMAX) 25 MG tablet Take 25 mg by mouth 2 (two) times daily.      Vitamin D, Ergocalciferol, (DRISDOL) 1.25 MG (50000 UNIT) CAPS capsule Take 50,000 Units by mouth once a week.     traMADol (ULTRAM) 50 MG tablet Take 1-2 tablets (50-100 mg total) by mouth every 6 (six) hours as needed for severe pain. (Patient not taking: Reported on 11/13/2021) 15 tablet 0   No current facility-administered medications on file prior to visit.    Allergies  Allergen Reactions   Covid-19 Ad26 Vaccine(Janssen) Anaphylaxis    Social History:  reports that she has never smoked. She has never used smokeless tobacco. She reports current alcohol use. She reports that she does not currently use drugs.  No family history on file.  The following portions of the patient's history were reviewed and updated as appropriate: allergies, current medications, past family history, past medical history, past social history, past surgical history and problem list.  Review of Systems Pertinent items noted in HPI and remainder of comprehensive ROS otherwise negative.  Physical Exam:  BP 134/89   Pulse 77   Wt 215 lb 1.6 oz (97.6 kg)   LMP 10/15/2021 (Exact Date)   BMI 39.34 kg/m  CONSTITUTIONAL: Well-developed, well-nourished female in no acute distress.  HENT:  Normocephalic, atraumatic, External right and left  ear normal. Oropharynx is clear and moist EYES: Conjunctivae and EOM are normal.  NECK: Normal range of motion, supple, no masses.  Normal thyroid.  SKIN: Skin is warm and dry. No rash noted. Not diaphoretic. No erythema. No pallor. MUSCULOSKELETAL: Normal range of motion. No tenderness.  No cyanosis, clubbing, or edema.  2+ distal pulses. NEUROLOGIC: Alert and oriented to person, place, and time. Normal reflexes, muscle tone coordination.  PSYCHIATRIC: Normal mood and affect. Normal behavior. Normal judgment and thought content. CARDIOVASCULAR: Normal heart rate noted, regular rhythm RESPIRATORY: Clear to auscultation bilaterally. Effort and breath sounds normal, no  problems with respiration noted. BREASTS:deferred ABDOMEN: Soft, no distention noted.  No tenderness, rebound or guarding.  PELVIC: Normal appearing external genitalia and urethral meatus; normal appearing vaginal mucosa and cervix.  No abnormal discharge noted.  Pap smear obtained.  Normal uterine size, no other palpable masses, no uterine or adnexal tenderness.  Performed in the presence of a chaperone.   Assessment and Plan:    1. Low grade squamous intraepithelial lesion (LGSIL) on cervical Pap smear Repap taken to re-eval cervix - Cytology - PAP  2. Dysmenorrhea Discussed high dose NSAIDs at the beginning of menses to address pain control while still allowing for conception  3. Women's annual routine gynecological examination Otherwise normal annual exam Monitor borderline BP  Will follow up results of pap smear and manage accordingly. Routine preventative health maintenance measures emphasized. Please refer to After Visit Summary for other counseling recommendations.     F/u in 1 year or prn Lynnda Shields, MD, [place], [facility]

## 2021-11-19 LAB — CYTOLOGY - PAP
Chlamydia: NEGATIVE
Comment: NEGATIVE
Comment: NORMAL
Neisseria Gonorrhea: NEGATIVE

## 2021-12-15 ENCOUNTER — Telehealth: Payer: Self-pay | Admitting: General Practice

## 2021-12-15 NOTE — Telephone Encounter (Signed)
Patient called and left message on nurse voicemail line stating she needs a refill on her tramadol.   Called patient and she states the prescription sent back in March went to the wrong pharmacy so she never received that one. She is requesting a refill for her painful periods. Told patient I will send a message to the provider and will reach out to her once I hear back.

## 2021-12-31 ENCOUNTER — Encounter: Payer: Self-pay | Admitting: General Practice

## 2022-01-01 ENCOUNTER — Telehealth: Payer: Self-pay

## 2022-01-01 NOTE — Telephone Encounter (Signed)
Pt called requesting a refill on her medication.  Called pt and pt requested a refill on her Tramadol.  Per chart review Elgie Congo declined refill.  Pt states that she does not understand why when she was told that Tramadol would be refilled for her.  I informed the pt that I would reach out to Dr. Elgie Congo and call her back.  Pt agreed.  Frances Nickels  01/01/22

## 2022-01-01 NOTE — Telephone Encounter (Addendum)
Called pt and let pt know that Dr. Elgie Congo has again declined her refill on her Tramadol.  I explained to the that Tramadol is not a medication that we would prescribe for long term management for dysmenorrhea.  I advised pt that I would recommend that she is scheduled with a provider to discuss her question of fibroids as she has not had an U/S nor has been dx.  Pt agreed to appt scheduled on 02/26/22 with Anyanwu.    Kristine Johnson  01/02/22

## 2022-02-26 ENCOUNTER — Ambulatory Visit: Payer: BC Managed Care – PPO | Admitting: Obstetrics & Gynecology

## 2022-03-12 ENCOUNTER — Ambulatory Visit: Payer: BC Managed Care – PPO | Admitting: Obstetrics and Gynecology

## 2022-07-01 ENCOUNTER — Ambulatory Visit (INDEPENDENT_AMBULATORY_CARE_PROVIDER_SITE_OTHER): Payer: BC Managed Care – PPO

## 2022-07-01 DIAGNOSIS — Z3201 Encounter for pregnancy test, result positive: Secondary | ICD-10-CM

## 2022-07-01 LAB — POCT PREGNANCY, URINE: Preg Test, Ur: POSITIVE — AB

## 2022-07-01 NOTE — Progress Notes (Signed)
Pt left urine for walk in pregnancy test resulting positive.  Pt reports mild cramping that is in the lower abd.  LMP 05/22/22, EDD 02/26/23 and 5w 5d today.  Medications and allergies reviewed.  Pt advised when to go to [facility] for evaluation.  Pt encouraged to start prenatal care and take PNV.  Pt verbalized understanding with no further questions.   Frances Nickels  07/01/22

## 2022-07-01 NOTE — Patient Instructions (Signed)
Safe Medications in Pregnancy   Acne:  Benzoyl Peroxide  Salicylic Acid   Backache/Headache:  Tylenol: 2 regular strength every 4 hours OR               2 Extra strength every 6 hours   Colds/Coughs/Allergies:  Benadryl (alcohol free) 25 mg every 6 hours as needed  Breath right strips  Claritin  Cepacol throat lozenges  Chloraseptic throat spray  Cold-Eeze- up to three times per day  Cough drops, alcohol free  Flonase (by prescription only)  Guaifenesin  Mucinex  Robitussin DM (plain only, alcohol free)  Saline nasal spray/drops  Sudafed (pseudoephedrine) & Actifed * use only after [redacted] weeks gestation and if you do not have high blood pressure  Tylenol  Vicks Vaporub  Zinc lozenges  Zyrtec   Constipation:  Colace  Ducolax suppositories  Fleet enema  Glycerin suppositories  Metamucil  Milk of magnesia  Miralax  Senokot  Smooth move tea   Diarrhea:  Kaopectate  Imodium A-D   *NO pepto Bismol   Hemorrhoids:  Anusol  Anusol HC  Preparation H  Tucks   Indigestion:  Tums  Maalox  Mylanta  Zantac  Pepcid   Insomnia:  Benadryl (alcohol free) '25mg'$  every 6 hours as needed  Tylenol PM  Unisom, no Gelcaps   Leg Cramps:  Tums  MagGel   Nausea/Vomiting:  Bonine  Dramamine  Emetrol  Ginger extract  Sea bands  Meclizine  Nausea medication to take during pregnancy:  Unisom (doxylamine succinate 25 mg tablets) Take one tablet daily at bedtime. If symptoms are not adequately controlled, the dose can be increased to a maximum recommended dose of two tablets daily (1/2 tablet in the morning, 1/2 tablet mid-afternoon and one at bedtime).  Vitamin B6 '100mg'$  tablets. Take one tablet twice a day (up to 200 mg per day).   Skin Rashes:  Aveeno products  Benadryl cream or '25mg'$  every 6 hours as needed  Calamine Lotion  1% cortisone cream   Yeast infection:  Gyne-lotrimin 7  Monistat 7    **If taking multiple medications, please check labels to avoid  duplicating the same active ingredients  **take medication as directed on the label  ** Do not exceed 4000 mg of tylenol in 24 hours  **Do not take medications that contain aspirin or ibuprofen           [facility] [place] for [place] locations:  Hours may vary. Please call for an appointment   [facility] [place] at [place], [place], [place], [place], [address] [phone]  Center for [facility] at [place], [place], [place], [place], [address] [phone]  Center for [facility] at [place], [place], [place], [place], [address] [phone]  Center for [employer] at [facility]  [address] [place], [place], [address] [phone]  [facility] [place] at [facility]                                    [address], [place], [place], [address] [phone]  [facility] for [place] at [facility] [address], [place], [place], [place], [address]

## 2022-07-06 ENCOUNTER — Other Ambulatory Visit: Payer: Self-pay | Admitting: *Deleted

## 2022-07-06 DIAGNOSIS — O219 Vomiting of pregnancy, unspecified: Secondary | ICD-10-CM

## 2022-07-06 MED ORDER — PROMETHAZINE HCL 25 MG PO TABS
25.0000 mg | ORAL_TABLET | Freq: Four times a day (QID) | ORAL | 0 refills | Status: DC | PRN
Start: 2022-07-06 — End: 2023-02-08

## 2022-07-07 ENCOUNTER — Other Ambulatory Visit: Payer: Self-pay

## 2022-07-07 DIAGNOSIS — Z3490 Encounter for supervision of normal pregnancy, unspecified, unspecified trimester: Secondary | ICD-10-CM

## 2022-07-07 MED ORDER — PRENATAL 27-1 MG PO TABS: 1.0000 | ORAL_TABLET | Freq: Every day | ORAL | 11 refills | Status: DC

## 2022-07-10 ENCOUNTER — Encounter (HOSPITAL_COMMUNITY): Payer: Self-pay | Admitting: *Deleted

## 2022-07-10 ENCOUNTER — Inpatient Hospital Stay (HOSPITAL_COMMUNITY)
Admission: AD | Admit: 2022-07-10 | Discharge: 2022-07-11 | Disposition: A | Discharge status: Home / Self Care | Payer: BC Managed Care – PPO | Attending: Obstetrics and Gynecology | Admitting: Obstetrics and Gynecology

## 2022-07-10 DIAGNOSIS — O209 Hemorrhage in early pregnancy, unspecified: Secondary | ICD-10-CM | POA: Insufficient documentation

## 2022-07-10 DIAGNOSIS — O26891 Other specified pregnancy related conditions, first trimester: Secondary | ICD-10-CM | POA: Diagnosis not present

## 2022-07-10 DIAGNOSIS — D259 Leiomyoma of uterus, unspecified: Secondary | ICD-10-CM | POA: Diagnosis not present

## 2022-07-10 DIAGNOSIS — O3411 Maternal care for benign tumor of corpus uteri, first trimester: Secondary | ICD-10-CM | POA: Diagnosis not present

## 2022-07-10 DIAGNOSIS — O26859 Spotting complicating pregnancy, unspecified trimester: Secondary | ICD-10-CM

## 2022-07-10 DIAGNOSIS — O26899 Other specified pregnancy related conditions, unspecified trimester: Secondary | ICD-10-CM

## 2022-07-10 DIAGNOSIS — Z3A01 Less than 8 weeks gestation of pregnancy: Secondary | ICD-10-CM | POA: Diagnosis not present

## 2022-07-10 DIAGNOSIS — Z79899 Other long term (current) drug therapy: Secondary | ICD-10-CM | POA: Insufficient documentation

## 2022-07-10 DIAGNOSIS — R1030 Lower abdominal pain, unspecified: Secondary | ICD-10-CM | POA: Diagnosis not present

## 2022-07-10 NOTE — MAU Note (Signed)
Pt says did HPT and confirmed at  clinic- with Dr Kennon Rounds Says spotting started at 830pm- tonight -when she wipes  Also feels cramping- started yesterday - 5

## 2022-07-11 ENCOUNTER — Inpatient Hospital Stay (HOSPITAL_COMMUNITY): Payer: BC Managed Care – PPO

## 2022-07-11 DIAGNOSIS — Z3A01 Less than 8 weeks gestation of pregnancy: Secondary | ICD-10-CM

## 2022-07-11 DIAGNOSIS — D259 Leiomyoma of uterus, unspecified: Secondary | ICD-10-CM | POA: Insufficient documentation

## 2022-07-11 DIAGNOSIS — R1030 Lower abdominal pain, unspecified: Secondary | ICD-10-CM | POA: Diagnosis not present

## 2022-07-11 DIAGNOSIS — O26891 Other specified pregnancy related conditions, first trimester: Secondary | ICD-10-CM

## 2022-07-11 DIAGNOSIS — O3411 Maternal care for benign tumor of corpus uteri, first trimester: Secondary | ICD-10-CM | POA: Diagnosis not present

## 2022-07-11 LAB — COMPREHENSIVE METABOLIC PANEL
ALT: 17 U/L (ref 0–44)
AST: 21 U/L (ref 15–41)
Albumin: 3.2 g/dL — ABNORMAL LOW (ref 3.5–5.0)
Alkaline Phosphatase: 58 U/L (ref 38–126)
Anion gap: 7 (ref 5–15)
BUN: 12 mg/dL (ref 6–20)
CO2: 25 mmol/L (ref 22–32)
Calcium: 9.3 mg/dL (ref 8.9–10.3)
Chloride: 103 mmol/L (ref 98–111)
Creatinine, Ser: 0.83 mg/dL (ref 0.44–1.00)
GFR, Estimated: >60 mL/min (ref >60)
Glucose, Bld: 93 mg/dL (ref 70–99)
Potassium: 3.8 mmol/L (ref 3.5–5.1)
Sodium: 135 mmol/L (ref 135–145)
Total Bilirubin: 0.3 mg/dL (ref 0.3–1.2)
Total Protein: 6.9 g/dL (ref 6.5–8.1)

## 2022-07-11 LAB — URINALYSIS, ROUTINE W REFLEX MICROSCOPIC
Bacteria, UA: NONE SEEN
Bilirubin Urine: NEGATIVE
Glucose, UA: NEGATIVE mg/dL
Ketones, ur: NEGATIVE mg/dL
Leukocytes,Ua: NEGATIVE
Nitrite: NEGATIVE
Protein, ur: NEGATIVE mg/dL
Specific Gravity, Urine: 1.021 (ref 1.005–1.030)
pH: 6 (ref 5.0–8.0)

## 2022-07-11 LAB — CBC
HCT: 36.1 % (ref 36.0–46.0)
Hemoglobin: 11.5 g/dL — ABNORMAL LOW (ref 12.0–15.0)
MCH: 24.7 pg — ABNORMAL LOW (ref 26.0–34.0)
MCHC: 31.9 g/dL (ref 30.0–36.0)
MCV: 77.6 fL — ABNORMAL LOW (ref 80.0–100.0)
Platelets: 299 10*3/uL (ref 150–400)
RBC: 4.65 MIL/uL (ref 3.87–5.11)
RDW: 16.2 % — ABNORMAL HIGH (ref 11.5–15.5)
WBC: 7.5 10*3/uL (ref 4.0–10.5)
nRBC: 0 % (ref 0.0–0.2)

## 2022-07-11 LAB — WET PREP, GENITAL
Clue Cells Wet Prep HPF POC: NONE SEEN
Sperm: NONE SEEN
Trich, Wet Prep: NONE SEEN
WBC, Wet Prep HPF POC: <10 (ref <10)
Yeast Wet Prep HPF POC: NONE SEEN

## 2022-07-11 LAB — HCG, QUANTITATIVE, PREGNANCY: hCG, Beta Chain, Quant, S: 69840 m[IU]/mL — ABNORMAL HIGH (ref <5)

## 2022-07-11 NOTE — MAU Provider Note (Signed)
History     CSN: [id number]  Arrival date and time: 07/10/22 2306   Event Date/Time   First Provider Initiated Contact with Patient 07/11/22 0146      Chief Complaint  Patient presents with   Abdominal Pain   Vaginal Bleeding   Kristine Johnson is a 37 y.o. G2P0010 at 71w1dby Definite LMP of 05/22/2022 who [facility]  She presents today for Abdominal Pain and Vaginal Bleeding.  She reports having spotting at 2030 and was just with wiping.  She reports cramping that has been present for a couple days.  She states it is located on the left side predominately and can be both sides at times.  She describes it as "light menstrual cramps, but then started to feel like pressure Thursday evening."  She states the pain is improved with showering and worsened with laying on the left side.     OB History     Gravida  2   Para      Term      Preterm      AB  1   Living         SAB  1   IAB      Ectopic      Multiple      Live Births              Past Medical History:  Diagnosis Date   Asthma    Migraine    Vaginal Pap smear, abnormal     Past Surgical History:  Procedure Laterality Date   TONSILLECTOMY  2014   WISDOM TOOTH EXTRACTION      No family history on file.  Social History   Tobacco Use   Smoking status: Never   Smokeless tobacco: Never  Substance Use Topics   Alcohol use: Yes    Comment: socially    Drug use: Not Currently    Allergies:  Allergies  Allergen Reactions   Covid-19 Ad26 Vaccine(Janssen) Anaphylaxis    Medications Prior to Admission  Medication Sig Dispense Refill Last Dose   albuterol (VENTOLIN HFA) 108 (90 Base) MCG/ACT inhaler Inhale 1-2 puffs into the lungs every 6 (six) hours as needed for shortness of breath or wheezing. 18 g 0    escitalopram (LEXAPRO) 10 MG tablet Take 10 mg by mouth daily. (Patient not taking: Reported on 07/01/2022)      Ferrous Gluconate 324 (37.5 Fe) MG TABS Take 1  tablet by mouth daily.      Prenatal 27-1 MG TABS Take 1 tablet by mouth daily. 30 tablet 11    promethazine (PHENERGAN) 25 MG tablet Take 1 tablet (25 mg total) by mouth every 6 (six) hours as needed for nausea or vomiting. 30 tablet 0    topiramate (TOPAMAX) 25 MG tablet Take 25 mg by mouth 2 (two) times daily. (Patient not taking: Reported on 07/01/2022)      Vitamin D, Ergocalciferol, (DRISDOL) 1.25 MG (50000 UNIT) CAPS capsule Take 50,000 Units by mouth once a week.       Review of Systems  Gastrointestinal:  Positive for abdominal pain (Cramping), constipation (Mild, Last BM last night-No difficulty) and nausea. Negative for diarrhea and vomiting.  Genitourinary:  Positive for vaginal bleeding (Bright pink, no clots). Negative for difficulty urinating, dysuria and vaginal discharge.   Physical Exam   Blood pressure 130/72, pulse 84, temperature 98.9 F (37.2 C), temperature source Oral, resp. rate 16, height 5' 1.5" (1.562 m), weight 102.9  kg, last menstrual period 05/22/2022.  Physical Exam Constitutional:      Appearance: Normal appearance. She is well-developed. She is obese.  HENT:     Head: Normocephalic and atraumatic.  Eyes:     Conjunctiva/sclera: Conjunctivae normal.  Cardiovascular:     Rate and Rhythm: Normal rate.  Pulmonary:     Effort: Pulmonary effort is normal. No respiratory distress.  Musculoskeletal:        General: Normal range of motion.     Cervical back: Normal range of motion.  Skin:    General: Skin is warm and dry.  Neurological:     Mental Status: She is alert and oriented to person, place, and time.  Psychiatric:        Mood and Affect: Mood normal.        Behavior: Behavior normal.     MAU Course  Procedures Results for orders placed or performed during the hospital encounter of 07/10/22 (from the past 24 hour(s))  Wet prep, genital     Status: None   Collection Time: 07/10/22 11:50 PM  Result Value Ref Range   Yeast Wet Prep HPF POC  NONE SEEN NONE SEEN   Trich, Wet Prep NONE SEEN NONE SEEN   Clue Cells Wet Prep HPF POC NONE SEEN NONE SEEN   WBC, Wet Prep HPF POC <10 <10   Sperm NONE SEEN   Urinalysis, Routine w reflex microscopic Urine, Clean Catch     Status: Abnormal   Collection Time: 07/11/22 12:01 AM  Result Value Ref Range   Color, Urine YELLOW YELLOW   APPearance HAZY (A) CLEAR   Specific Gravity, Urine 1.021 1.005 - 1.030   pH 6.0 5.0 - 8.0   Glucose, UA NEGATIVE NEGATIVE mg/dL   Hgb urine dipstick SMALL (A) NEGATIVE   Bilirubin Urine NEGATIVE NEGATIVE   Ketones, ur NEGATIVE NEGATIVE mg/dL   Protein, ur NEGATIVE NEGATIVE mg/dL   Nitrite NEGATIVE NEGATIVE   Leukocytes,Ua NEGATIVE NEGATIVE   RBC / HPF 0-5 0 - 5 RBC/hpf   WBC, UA 0-5 0 - 5 WBC/hpf   Bacteria, UA NONE SEEN NONE SEEN   Squamous Epithelial / HPF 0-5 0 - 5 /HPF   Mucus PRESENT   CBC     Status: Abnormal   Collection Time: 07/11/22 12:07 AM  Result Value Ref Range   WBC 7.5 4.0 - 10.5 K/uL   RBC 4.65 3.87 - 5.11 MIL/uL   Hemoglobin 11.5 (L) 12.0 - 15.0 g/dL   HCT 36.1 36.0 - 46.0 %   MCV 77.6 (L) 80.0 - 100.0 fL   MCH 24.7 (L) 26.0 - 34.0 pg   MCHC 31.9 30.0 - 36.0 g/dL   RDW 16.2 (H) 11.5 - 15.5 %   Platelets 299 150 - 400 K/uL   nRBC 0.0 0.0 - 0.2 %  hCG, quantitative, pregnancy     Status: Abnormal   Collection Time: 07/11/22 12:07 AM  Result Value Ref Range   hCG, Beta Chain, Quant, S 69,840 (H) <5 mIU/mL  Comprehensive metabolic panel     Status: Abnormal   Collection Time: 07/11/22 12:07 AM  Result Value Ref Range   Sodium 135 135 - 145 mmol/L   Potassium 3.8 3.5 - 5.1 mmol/L   Chloride 103 98 - 111 mmol/L   CO2 25 22 - 32 mmol/L   Glucose, Bld 93 70 - 99 mg/dL   BUN 12 6 - 20 mg/dL   Creatinine, Ser 0.83 0.44 - 1.00 mg/dL  Calcium 9.3 8.9 - 10.3 mg/dL   Total Protein 6.9 6.5 - 8.1 g/dL   Albumin 3.2 (L) 3.5 - 5.0 g/dL   AST 21 15 - 41 U/L   ALT 17 0 - 44 U/L   Alkaline Phosphatase 58 38 - 126 U/L   Total Bilirubin  0.3 0.3 - 1.2 mg/dL   GFR, Estimated >60 >60 mL/min   Anion gap 7 5 - 15   US OB LESS THAN 14 WEEKS WITH OB TRANSVAGINAL  Result Date: 07/11/2022 CLINICAL DATA:  Vaginal spotting with cramping. EXAM: OBSTETRIC <14 WK [place] AND TRANSVAGINAL OB US TECHNIQUE: Both transabdominal and transvaginal ultrasound examinations were performed for complete evaluation of the gestation as well as the maternal uterus, adnexal regions, and pelvic cul-de-sac. Transvaginal technique was performed to assess early pregnancy. COMPARISON:  None Available. FINDINGS: Intrauterine gestational sac: Single Yolk sac:  Visualized. Embryo:  Visualized. Cardiac Activity: Visualized. Heart Rate: 129 bpm CRL:  8.8 mm   6 w   6 d                  [place] EDC: February 28, 2023 Subchorionic hemorrhage:  None visualized. Maternal uterus/adnexae: A 4.6 cm x 5.2 cm x 3.4 cm anterior uterine fibroid is noted on the right. The bilateral ovaries are visualized and are normal in appearance. No pelvic free fluid is seen. IMPRESSION: Single, viable intrauterine pregnancy at approximately 6 weeks and 6 days gestation by ultrasound evaluation. Electronically Signed   By: Virgina Norfolk M.D.   On: 07/11/2022 01:28     MDM Cultures: Wet Prep and GC/CT Labs: UA, UPT, CBC, hCG, CMP Ultrasound Assessment and Plan  37 year old G2P0010 at 7.1 weeks Vaginal Spotting Abdominal Cramping Uterine Fibroids  -Labs ordered while patient in triage. -[place] completed. -Provider to bedside to review results.  -Reassured of fetal well-being. -Informed of uterine fibroid.  Will place information in AVS.  -Discussed pain and patient states she will take tylenol at home. -Will give pregnancy safe med list in AVS. -Informed of other normal results. -Precautions reviewed. -Encouraged to call primary office or return to MAU if symptoms worsen or with the onset of new symptoms. -Discharged to home in stable condition.  Maryann Conners 07/11/2022, 1:46 AM

## 2022-07-11 NOTE — Discharge Instructions (Signed)

## 2022-07-13 LAB — GC/CHLAMYDIA PROBE AMP (~~LOC~~) NOT AT ARMC
Chlamydia: NEGATIVE
Comment: NEGATIVE
Comment: NORMAL
Neisseria Gonorrhea: NEGATIVE

## 2022-07-16 ENCOUNTER — Encounter (HOSPITAL_COMMUNITY): Payer: Self-pay | Admitting: Obstetrics & Gynecology

## 2022-07-16 ENCOUNTER — Other Ambulatory Visit: Payer: Self-pay

## 2022-07-16 ENCOUNTER — Inpatient Hospital Stay (HOSPITAL_COMMUNITY)
Admission: AD | Admit: 2022-07-16 | Discharge: 2022-07-16 | Disposition: A | Discharge status: Home / Self Care | Payer: BC Managed Care – PPO | Attending: Obstetrics & Gynecology | Admitting: Obstetrics & Gynecology

## 2022-07-16 DIAGNOSIS — Z6740 Type O blood, Rh positive: Secondary | ICD-10-CM

## 2022-07-16 DIAGNOSIS — O209 Hemorrhage in early pregnancy, unspecified: Secondary | ICD-10-CM | POA: Diagnosis present

## 2022-07-16 DIAGNOSIS — Z3A01 Less than 8 weeks gestation of pregnancy: Secondary | ICD-10-CM

## 2022-07-16 NOTE — MAU Note (Signed)
Kristine Johnson is a 37 y.o. at 31w6dhere in MAU reporting: spotting has increased since previous MAU visit. States having to wear a panty liner. Had some cramping earlier but no pain currently.   Onset of complaint: ongoing but worse  Pain score: 0/10  Vitals:   07/16/22 1327  BP: 137/74  Pulse: 95  Resp: 16  Temp: 98.4 F (36.9 C)  SpO2: 100%     FHT:NA  Lab orders placed from triage: UA

## 2022-07-16 NOTE — MAU Provider Note (Signed)
History     CSN: [id number]  Arrival date and time: 07/16/22 1306   Event Date/Time   First Provider Initiated Contact with Patient 07/16/22 1411      Chief Complaint  Patient presents with   Vaginal Bleeding   Kristine Johnson is a 37 y.o.G2P0010 at 44w6dwho presents today with vaginal bleeding. She was seen for this last week and had an [place] IUP at that time. She states that since her visit bleeding has increased some. She is also having some intermittent cramping that she rates 3/10 maybe once every few hours. Currently she is having 5/10 back pain.   Vaginal Bleeding The patient's primary symptoms include pelvic pain and vaginal bleeding. This is a new problem. The current episode started in the past 7 days. The problem occurs intermittently. The problem has been waxing and waning. She is pregnant. Associated symptoms include back pain. The vaginal discharge was bloody. The vaginal bleeding is lighter than menses. She has not been passing clots. She has not been passing tissue. Menstrual history: LMP 05/22/2022.    OB History     Gravida  2   Para      Term      Preterm      AB  1   Living         SAB  1   IAB      Ectopic      Multiple      Live Births              Past Medical History:  Diagnosis Date   Asthma    Migraine    Vaginal Pap smear, abnormal     Past Surgical History:  Procedure Laterality Date   HERNIA REPAIR     TONSILLECTOMY  2014   WISDOM TOOTH EXTRACTION      Family History  Problem Relation Age of Onset   Diabetes Father     Social History   Tobacco Use   Smoking status: Never   Smokeless tobacco: Never  Substance Use Topics   Alcohol use: Not Currently    Comment: socially    Drug use: Not Currently    Allergies:  Allergies  Allergen Reactions   Covid-19 Ad26 Vaccine(Janssen) Anaphylaxis    Medications Prior to Admission  Medication Sig Dispense Refill Last Dose   Ferrous Gluconate 324 (37.5  Fe) MG TABS Take 1 tablet by mouth daily.   07/16/2022   Prenatal 27-1 MG TABS Take 1 tablet by mouth daily. 30 tablet 11 07/16/2022   albuterol (VENTOLIN HFA) 108 (90 Base) MCG/ACT inhaler Inhale 1-2 puffs into the lungs every 6 (six) hours as needed for shortness of breath or wheezing. 18 g 0    promethazine (PHENERGAN) 25 MG tablet Take 1 tablet (25 mg total) by mouth every 6 (six) hours as needed for nausea or vomiting. 30 tablet 0     Review of Systems  Genitourinary:  Positive for pelvic pain and vaginal bleeding.  Musculoskeletal:  Positive for back pain.  All other systems reviewed and are negative.  Physical Exam   Blood pressure 127/74, pulse 89, temperature 98.4 F (36.9 C), temperature source Oral, resp. rate 16, height 5' 1.5" (1.562 m), weight 102.2 kg, last menstrual period 05/22/2022, SpO2 100 %.  Physical Exam Constitutional:      Appearance: She is well-developed.  HENT:     Head: Normocephalic.  Eyes:     Pupils: Pupils are equal, round, and reactive  to light.  Cardiovascular:     Rate and Rhythm: Normal rate.  Pulmonary:     Effort: Pulmonary effort is normal. No respiratory distress.  Abdominal:     Palpations: Abdomen is soft.     Tenderness: There is no abdominal tenderness.  Genitourinary:    Vagina: No bleeding.     Comments:      Musculoskeletal:        General: Normal range of motion.     Cervical back: Normal range of motion and neck supple.  Skin:    General: Skin is warm and dry.  Neurological:     Mental Status: She is alert and oriented to person, place, and time.  Psychiatric:        Mood and Affect: Mood normal.        Behavior: Behavior normal.    Pt informed that the ultrasound is considered a limited OB ultrasound and is not intended to be a complete ultrasound exam.  Patient also informed that the ultrasound is not being completed with the intent of assessing for fetal or placental anomalies or any pelvic abnormalities.  Explained  that the purpose of today's ultrasound is to assess for  viability.  Patient acknowledges the purpose of the exam and the limitations of the study.    FHT 160 on [place] today  CRL consistent with dates  MAU Course  Procedures  MDM   Assessment and Plan   1. Vaginal bleeding in pregnancy, first trimester   2. [redacted] weeks gestation of pregnancy   3. Type O blood, Rh positive    DC home in stable condition  1st Trimester precautions  Bleeding precautions RX: none  Return to MAU as needed FU with OB as planned   [place] [facility] Follow up.   Specialty: Obstetrics and Gynecology Contact information: [place] [phone] [place] DNP, CNM  07/16/22  2:41 PM

## 2022-08-03 ENCOUNTER — Encounter: Payer: Self-pay | Admitting: Family Medicine

## 2022-08-06 ENCOUNTER — Telehealth (INDEPENDENT_AMBULATORY_CARE_PROVIDER_SITE_OTHER): Payer: Medicaid Other

## 2022-08-06 DIAGNOSIS — Z3689 Encounter for other specified antenatal screening: Secondary | ICD-10-CM

## 2022-08-06 DIAGNOSIS — Z3480 Encounter for supervision of other normal pregnancy, unspecified trimester: Secondary | ICD-10-CM | POA: Insufficient documentation

## 2022-08-06 MED ORDER — BLOOD PRESSURE MONITORING DEVI
1.0000 | 0 refills | Status: DC
Start: 2022-08-06 — End: 2023-04-01

## 2022-08-06 NOTE — Progress Notes (Signed)
Pt is concerned that she's been having some spotting.

## 2022-08-06 NOTE — Progress Notes (Signed)
New OB Intake  I connected with Kristine Johnson  on 08/06/22 at 10:15 AM EST by MyChart Video Visit and verified that I am speaking with the correct person using two identifiers. Nurse is located at [facility] and pt is located at home.  I discussed the limitations, risks, security and privacy concerns of performing an evaluation and management service by telephone and the availability of in person appointments. I also discussed with the patient that there may be a patient responsible charge related to this service. The patient expressed understanding and agreed to proceed.  I explained I am completing New OB Intake today. We discussed EDD of 02/26/23 that is based on LMP of 05/22/22. Pt is G2/P0. I reviewed her allergies, medications, Medical/Surgical/OB history, and appropriate screenings. I informed her of [name] services. [name] information placed in AVS. Based on history, this is a low risk pregnancy.  Patient Active Problem List   Diagnosis Date Noted   Uterine leiomyoma 07/11/2022   Low grade squamous intraepithelial lesion (LGSIL) on cervical Pap smear 12/19/2020   Dysmenorrhea 11/04/2020   Women's annual routine gynecological examination 11/04/2020    Concerns addressed today  Delivery Plans Plans to deliver at [name]. Patient given information for [name] Healthy Baby website for more information about Women's and [place]. Patient is not interested in water birth. Offered upcoming OB visit with CNM to discuss further.  MyChart/Babyscripts MyChart access verified. I explained pt will have some visits in office and some virtually. Babyscripts instructions given and order placed. Patient verifies receipt of registration text/e-mail. Account successfully created and app downloaded.  Blood Pressure Cuff/Weight Scale Blood pressure cuff ordered for patient to pick-up from [employer]. Explained after first prenatal appt pt will check weekly and document in 34.  Patient does have weight scale.  Anatomy US Explained first scheduled [place] will be around 19 weeks. Anatomy US scheduled for 10/02/22 at 0930a. Pt notified to arrive at 0915a.  Labs Discussed Kristine Johnson genetic screening with patient. Would like both Panorama and Horizon drawn at new OB visit. Routine prenatal labs needed.  COVID Vaccine Patient has had COVID vaccine.   Is patient a CenteringPregnancy candidate?  Declined Declined due to  Due to past SAB, She was told in hospital that she is high risk due to bleeding, so she feels more comfortable seeing actual MDs  If accepted,    Is patient a Mom+Baby Combined Care candidate?  Declined     Social Determinants of Health Food Insecurity: Patient denies food insecurity. WIC Referral: Patient is interested in referral to [facility].  Transportation: Patient denies transportation needs. Childcare: Discussed no children allowed at ultrasound appointments. Offered childcare services; patient declines childcare services at this time.  Interested in [place]? If yes, send referral.   First visit review I reviewed new OB appt with patient. Explained pt will be seen by Dr. Kennon Rounds at first visit; encounter routed to appropriate provider. Explained that patient will be seen by pregnancy navigator following visit with provider.   Kristine Johnson, CMA 08/06/2022  10:17 AM

## 2022-08-10 ENCOUNTER — Inpatient Hospital Stay (HOSPITAL_COMMUNITY)
Admission: AD | Admit: 2022-08-10 | Discharge: 2022-08-10 | Disposition: A | Discharge status: Home / Self Care | Payer: BC Managed Care – PPO | Attending: Family Medicine | Admitting: Family Medicine

## 2022-08-10 ENCOUNTER — Inpatient Hospital Stay (HOSPITAL_COMMUNITY): Payer: BC Managed Care – PPO

## 2022-08-10 DIAGNOSIS — J45909 Unspecified asthma, uncomplicated: Secondary | ICD-10-CM | POA: Insufficient documentation

## 2022-08-10 DIAGNOSIS — Z3480 Encounter for supervision of other normal pregnancy, unspecified trimester: Secondary | ICD-10-CM

## 2022-08-10 DIAGNOSIS — R109 Unspecified abdominal pain: Secondary | ICD-10-CM | POA: Diagnosis not present

## 2022-08-10 DIAGNOSIS — O26851 Spotting complicating pregnancy, first trimester: Secondary | ICD-10-CM

## 2022-08-10 DIAGNOSIS — O23591 Infection of other part of genital tract in pregnancy, first trimester: Secondary | ICD-10-CM | POA: Diagnosis present

## 2022-08-10 DIAGNOSIS — O209 Hemorrhage in early pregnancy, unspecified: Secondary | ICD-10-CM | POA: Diagnosis not present

## 2022-08-10 DIAGNOSIS — Z7951 Long term (current) use of inhaled steroids: Secondary | ICD-10-CM | POA: Diagnosis not present

## 2022-08-10 DIAGNOSIS — O3411 Maternal care for benign tumor of corpus uteri, first trimester: Secondary | ICD-10-CM | POA: Insufficient documentation

## 2022-08-10 DIAGNOSIS — B9689 Other specified bacterial agents as the cause of diseases classified elsewhere: Secondary | ICD-10-CM | POA: Diagnosis not present

## 2022-08-10 DIAGNOSIS — D251 Intramural leiomyoma of uterus: Secondary | ICD-10-CM | POA: Insufficient documentation

## 2022-08-10 DIAGNOSIS — Z3A11 11 weeks gestation of pregnancy: Secondary | ICD-10-CM | POA: Insufficient documentation

## 2022-08-10 DIAGNOSIS — O99511 Diseases of the respiratory system complicating pregnancy, first trimester: Secondary | ICD-10-CM | POA: Diagnosis not present

## 2022-08-10 DIAGNOSIS — N760 Acute vaginitis: Secondary | ICD-10-CM | POA: Insufficient documentation

## 2022-08-10 DIAGNOSIS — O26891 Other specified pregnancy related conditions, first trimester: Secondary | ICD-10-CM | POA: Diagnosis not present

## 2022-08-10 LAB — WET PREP, GENITAL
Sperm: NONE SEEN
Trich, Wet Prep: NONE SEEN
WBC, Wet Prep HPF POC: <10 (ref <10)
Yeast Wet Prep HPF POC: NONE SEEN

## 2022-08-10 MED ORDER — FLUCONAZOLE 150 MG PO TABS: 150.0000 mg | ORAL_TABLET | Freq: Every day | ORAL | 1 refills | Status: DC

## 2022-08-10 MED ORDER — METRONIDAZOLE 500 MG PO TABS: 500.0000 mg | ORAL_TABLET | Freq: Two times a day (BID) | ORAL | 0 refills | Status: DC

## 2022-08-10 NOTE — MAU Provider Note (Signed)
Chief Complaint:  Vaginal Bleeding and Abdominal Pain  HPI: Kristine Johnson is a 37 y.o. G2P0010 at 80w3dwho presents to maternity admissions reporting spotting for several weeks that has gotten heavier, closer to that of a light period for the past few days. Also having some cramping. No other physical complaints.   [facility]: [facility] has a new OB appt set up with Dr. PKennon Rounds  Past Medical History:  Diagnosis Date   Asthma    Migraine    Vaginal Pap smear, abnormal    OB History  Gravida Para Term Preterm AB Living  2       1    SAB IAB Ectopic Multiple Live Births  1            # Outcome Date GA Lbr Len/2nd Weight Sex Delivery Anes PTL Lv  2 Current           1 SAB            Past Surgical History:  Procedure Laterality Date   HERNIA REPAIR  12/1996   TONSILLECTOMY  2014   WISDOM TOOTH EXTRACTION     Family History  Problem Relation Age of Onset   Diabetes Father    Social History   Tobacco Use   Smoking status: Never   Smokeless tobacco: Never  Substance Use Topics   Alcohol use: Not Currently    Comment: socially    Drug use: Not Currently   Allergies  Allergen Reactions   Covid-19 Ad26 Vaccine(Janssen) Anaphylaxis   Medications Prior to Admission  Medication Sig Dispense Refill Last Dose   acetaminophen (TYLENOL) 325 MG tablet Take 650 mg by mouth every 6 (six) hours as needed. As needed      albuterol (VENTOLIN HFA) 108 (90 Base) MCG/ACT inhaler Inhale 1-2 puffs into the lungs every 6 (six) hours as needed for shortness of breath or wheezing. 18 g 0    Blood Pressure Monitoring DEVI 1 each by Does not apply route once a week. 1 each 0    Ferrous Gluconate 324 (37.5 Fe) MG TABS Take 1 tablet by mouth daily.      Prenatal 27-1 MG TABS Take 1 tablet by mouth daily. 30 tablet 11    promethazine (PHENERGAN) 25 MG tablet Take 1 tablet (25 mg total) by mouth every 6 (six) hours as needed for nausea or  vomiting. 30 tablet 0    I have reviewed patient's Past Medical Hx, Surgical Hx, Family Hx, Social Hx, medications and allergies.   ROS:  Pertinent items noted in HPI and remainder of comprehensive ROS otherwise negative.   Physical Exam  Patient Vitals for the past 24 hrs:  BP Temp Temp src Pulse Resp SpO2 Height Weight  08/10/22 1221 137/70 98.7 F (37.1 C) Oral 87 18 100 % 5' 1.5" (1.562 m) 229 lb 8 oz (104.1 kg)   Constitutional: Well-developed, well-nourished female in no acute distress.  Cardiovascular: normal rate & rhythm, warm and well-perfused Respiratory: normal effort, no problems with respiration noted GI: Abd soft, non-tender, gravid appropriate for gestational age MS: Extremities nontender, no edema, normal ROM Neurologic: Alert and oriented x 4.  GU: no CVA tenderness Pelvic:exam deferred, pt reported to MAU provider that the bleeding is only with wiping  FHR: 163   Labs: Results for orders placed or performed during the hospital encounter of 08/10/22 (from the past 24 hour(s))  Wet prep, genital  Status: Abnormal   Collection Time: 08/10/22 12:50 PM  Result Value Ref Range   Yeast Wet Prep HPF POC NONE SEEN NONE SEEN   Trich, Wet Prep NONE SEEN NONE SEEN   Clue Cells Wet Prep HPF POC PRESENT (A) NONE SEEN   WBC, Wet Prep HPF POC <10 <10   Sperm NONE SEEN    Imaging:  US OB Comp Less 14 Wks  Result Date: 08/10/2022 CLINICAL DATA:  [id number] Vaginal bleeding in pregnancy, first trimester [id number] EXAM: OBSTETRIC <14 WK ULTRASOUND TECHNIQUE: Transabdominal ultrasound was performed for evaluation of the gestation as well as the maternal uterus and adnexal regions. COMPARISON:  07/11/2022 FINDINGS: Intrauterine gestational sac: Single Yolk sac:  Not Visualized. Embryo:  Visualized. Cardiac Activity: Visualized. Heart Rate: 170 bpm CRL:   44.4 mm   11 w 1 d                  [place] EDC: 02/28/2023 Subchorionic hemorrhage:  None visualized. Maternal uterus/adnexae: 4.4 cm  intramural fibroid is again noted. Right ovary is unremarkable. The left ovary is not well seen. No free fluid. IMPRESSION: 1. Single live intrauterine pregnancy as above estimated age 90 weeks and 1 day. 2. Stable uterine fibroid. Electronically Signed   By: Randa Ngo M.D.   On: 08/10/2022 14:26    MAU Course: Orders Placed This Encounter  Procedures   Wet prep, genital   US OB Comp Less 14 Wks   Discharge patient   No orders of the defined types were placed in this encounter.  Bleeding stable and with wiping only. Pt self-collected swabs and went to ultrasound.  U/S normal without signs of bleeding, wet prep positive for BV. Explained to pt how BV can cause uterine irritation/cramping and spotting due to cervical irritation. Treatment sent to pharmacy.  MDM: Moderate  Assessment: 1. Supervision of other normal pregnancy, antepartum   2. Spotting affecting pregnancy in first trimester   3. [redacted] weeks gestation of pregnancy   4. Bacterial vaginosis    Plan: Discharge home in stable condition with bleeding precautions    [place] [facility] [place] [facility] Follow up.   Specialty: Obstetrics and Gynecology Why: as scheduled for ongoing prenatal care Contact information: [place] [phone]                Allergies as of 08/10/2022       Reactions   Covid-19 Ad26 Vaccine(janssen) Anaphylaxis        Medication List     TAKE these medications    acetaminophen 325 MG tablet Commonly known as: TYLENOL Take 650 mg by mouth every 6 (six) hours as needed. As needed   albuterol 108 (90 Base) MCG/ACT inhaler Commonly known as: VENTOLIN HFA Inhale 1-2 puffs into the lungs every 6 (six) hours as needed for shortness of breath or wheezing.   Blood Pressure Monitoring Devi 1 each by Does not apply route once a week.   Ferrous Gluconate 324 (37.5 Fe) MG Tabs Take 1  tablet by mouth daily.   Prenatal 27-1 MG Tabs Take 1 tablet by mouth daily.   promethazine 25 MG tablet Commonly known as: PHENERGAN Take 1 tablet (25 mg total) by mouth every 6 (six) hours as needed for nausea or vomiting.       Gaylan Gerold, CNM, MSN, [place] Certified Nurse Midwife, [facility]

## 2022-08-10 NOTE — MAU Note (Addendum)
Kristine Johnson is a 37 y.o. at 84w3dhere in MAU reporting: been spotting for some wks now.  Seems like it is getting worse the last few days. Had [place], has a fibroid. Cramping. Bleeding is like a light period today  Onset of complaint: on going Pain score: 2-3 Vitals:   08/10/22 1221  BP: 137/70  Pulse: 87  Resp: 18  Temp: 98.7 F (37.1 C)  SpO2: 100%     FHT:163 Lab orders placed from triage:  none   Urine collected

## 2022-08-11 LAB — GC/CHLAMYDIA PROBE AMP (~~LOC~~) NOT AT ARMC
Chlamydia: NEGATIVE
Comment: NEGATIVE
Comment: NORMAL
Neisseria Gonorrhea: NEGATIVE

## 2022-08-13 ENCOUNTER — Ambulatory Visit (INDEPENDENT_AMBULATORY_CARE_PROVIDER_SITE_OTHER): Payer: BC Managed Care – PPO | Admitting: Family Medicine

## 2022-08-13 ENCOUNTER — Other Ambulatory Visit: Payer: Self-pay

## 2022-08-13 ENCOUNTER — Encounter: Payer: Self-pay | Admitting: Family Medicine

## 2022-08-13 VITALS — BP 136/90 | HR 79 | Wt 226.8 lb

## 2022-08-13 DIAGNOSIS — Z3A11 11 weeks gestation of pregnancy: Secondary | ICD-10-CM | POA: Diagnosis not present

## 2022-08-13 DIAGNOSIS — O161 Unspecified maternal hypertension, first trimester: Secondary | ICD-10-CM

## 2022-08-13 DIAGNOSIS — N760 Acute vaginitis: Secondary | ICD-10-CM

## 2022-08-13 DIAGNOSIS — F419 Anxiety disorder, unspecified: Secondary | ICD-10-CM

## 2022-08-13 DIAGNOSIS — B9689 Other specified bacterial agents as the cause of diseases classified elsewhere: Secondary | ICD-10-CM | POA: Diagnosis not present

## 2022-08-13 DIAGNOSIS — Z3480 Encounter for supervision of other normal pregnancy, unspecified trimester: Secondary | ICD-10-CM

## 2022-08-13 MED ORDER — NIFEDIPINE ER OSMOTIC RELEASE 30 MG PO TB24: 30.0000 mg | ORAL_TABLET | Freq: Every day | ORAL | 2 refills | Status: DC

## 2022-08-13 MED ORDER — ASPIRIN 81 MG PO CHEW: 81.0000 mg | CHEWABLE_TABLET | Freq: Every day | ORAL | 3 refills | Status: DC

## 2022-08-13 MED ORDER — METRONIDAZOLE 0.75 % VA GEL: 1.0000 | Freq: Every day | VAGINAL | 1 refills | Status: DC

## 2022-08-13 NOTE — Progress Notes (Signed)
Subjective:   Kristine Johnson is a 37 y.o. G2P0010 at 62w6dby LMP, early ultrasound being seen today for her first obstetrical visit.  Her obstetrical history is not significant. Pregnancy history fully reviewed.  Patient reports headache.  HISTORY: OB History  Gravida Para Term Preterm AB Living  2 0 0 0 1 0  SAB IAB Ectopic Multiple Live Births  1 0 0 0 0    # Outcome Date GA Lbr Len/2nd Weight Sex Delivery Anes PTL Lv  2 Current           1 SAB            Last pap smear was  11/13/21 and was abnormal - LGSIL Past Medical History:  Diagnosis Date   Asthma    Migraine    Vaginal Pap smear, abnormal    Past Surgical History:  Procedure Laterality Date   HERNIA REPAIR  12/1996   TONSILLECTOMY  2014   WISDOM TOOTH EXTRACTION     Family History  Problem Relation Age of Onset   Diabetes Father    Social History   Tobacco Use   Smoking status: Never   Smokeless tobacco: Never  Substance Use Topics   Alcohol use: Not Currently    Comment: socially    Drug use: Not Currently   Allergies  Allergen Reactions   Covid-19 Ad26 Vaccine(Janssen) Anaphylaxis   Current Outpatient Medications on File Prior to Visit  Medication Sig Dispense Refill   acetaminophen (TYLENOL) 325 MG tablet Take 650 mg by mouth every 6 (six) hours as needed. As needed     albuterol (VENTOLIN HFA) 108 (90 Base) MCG/ACT inhaler Inhale 1-2 puffs into the lungs every 6 (six) hours as needed for shortness of breath or wheezing. 18 g 0   Blood Pressure Monitoring DEVI 1 each by Does not apply route once a week. 1 each 0   Ferrous Gluconate 324 (37.5 Fe) MG TABS Take 1 tablet by mouth daily.     Prenatal 27-1 MG TABS Take 1 tablet by mouth daily. 30 tablet 11   fluconazole (DIFLUCAN) 150 MG tablet Take 1 tablet (150 mg total) by mouth daily. (Patient not taking: Reported on 08/13/2022) 1 tablet 1   promethazine (PHENERGAN) 25 MG tablet Take 1 tablet (25 mg total) by mouth every 6 (six)  hours as needed for nausea or vomiting. (Patient not taking: Reported on 08/13/2022) 30 tablet 0   No current facility-administered medications on file prior to visit.     Exam   Vitals:   08/13/22 0943 08/13/22 1153  BP: (!) 143/92 (!) 136/90  Pulse: 97 79  Weight: 226 lb 12.8 oz (102.9 kg)       System: General: well-developed, well-nourished female in no acute distress   Skin: normal coloration and turgor, no rashes   Neurologic: oriented, normal, negative, normal mood   Extremities: normal strength, tone, and muscle mass, ROM of all joints is normal   HEENT PERRLA, extraocular movement intact and sclera clear, anicteric   Mouth/Teeth mucous membranes moist, pharynx normal without lesions and dental hygiene good   Neck supple and no masses   Cardiovascular: regular rate and rhythm   Respiratory:  no respiratory distress, normal breath sounds   Abdomen: soft, non-tender; bowel sounds normal; no masses,  no organomegaly     Assessment:   Pregnancy: G2P0010 Patient Active Problem List   Diagnosis Date Noted   Supervision of other normal pregnancy, antepartum 08/06/2022  Uterine leiomyoma 07/11/2022   Low grade squamous intraepithelial lesion (LGSIL) on cervical Pap smear 12/19/2020   Dysmenorrhea 11/04/2020     Plan:  1. Supervision of other normal pregnancy, antepartum New OB labs - Panorama Prenatal Test Full Panel - CBC/D/Plt+RPR+Rh+ABO+RubIgG... - HgB A1c - Culture, OB Urine - [facility] Custom  2. Elevated blood pressure affecting pregnancy in first trimester, antepartum Previously elevated outside of pregnancy  Begin Procardia and ASA Baseline labs - Protein / creatinine ratio, urine - Comprehensive metabolic panel - TSH - aspirin 81 MG chewable tablet; Chew 1 tablet (81 mg total) by mouth daily.  Dispense: 90 tablet; Refill: 3 - NIFEdipine (PROCARDIA XL) 30 MG 24 hr tablet; Take 1 tablet (30 mg total) by mouth daily.  Dispense: 90 tablet; Refill: 2  3.  Bacterial vaginosis  Flagyl makes her sick, change to megrogel - metroNIDAZOLE (METROGEL) 0.75 % vaginal gel; Place 1 Applicatorful vaginally at bedtime. Apply one applicatorful to vagina at bedtime for 5 days  Dispense: 70 g; Refill: 1  4. Anxiety IBH referral. Likely contributing to her headaches. - Ambulatory referral to [place]     Initial labs drawn. Continue prenatal vitamins. Genetic Screening discussed, NIPS: ordered. Ultrasound discussed; fetal anatomic survey: ordered. Problem list reviewed and updated. The nature of [place] with multiple MDs and other Advanced Practice Providers was explained to patient; also emphasized that residents, students are part of our team. Routine obstetric precautions reviewed. No follow-ups on file.

## 2022-08-14 ENCOUNTER — Encounter: Payer: Self-pay | Admitting: Family Medicine

## 2022-08-14 DIAGNOSIS — R7303 Prediabetes: Secondary | ICD-10-CM

## 2022-08-14 HISTORY — DX: Prediabetes: R73.03

## 2022-08-14 LAB — COMPREHENSIVE METABOLIC PANEL
ALT: 16 IU/L (ref 0–32)
AST: 15 IU/L (ref 0–40)
Albumin/Globulin Ratio: 1.4 (ref 1.2–2.2)
Albumin: 3.7 g/dL — ABNORMAL LOW (ref 3.9–4.9)
Alkaline Phosphatase: 61 IU/L (ref 44–121)
BUN/Creatinine Ratio: 15 (ref 9–23)
BUN: 9 mg/dL (ref 6–20)
Bilirubin Total: <0.2 mg/dL (ref 0.0–1.2)
CO2: 20 mmol/L (ref 20–29)
Calcium: 9.3 mg/dL (ref 8.7–10.2)
Chloride: 103 mmol/L (ref 96–106)
Creatinine, Ser: 0.61 mg/dL (ref 0.57–1.00)
Globulin, Total: 2.7 g/dL (ref 1.5–4.5)
Glucose: 104 mg/dL — ABNORMAL HIGH (ref 70–99)
Potassium: 4 mmol/L (ref 3.5–5.2)
Sodium: 136 mmol/L (ref 134–144)
Total Protein: 6.4 g/dL (ref 6.0–8.5)
eGFR: 120 mL/min/{1.73_m2} (ref >59)

## 2022-08-14 LAB — CBC/D/PLT+RPR+RH+ABO+RUBIGG...
Antibody Screen: NEGATIVE
Basophils Absolute: 0 10*3/uL (ref 0.0–0.2)
Basos: 0 %
EOS (ABSOLUTE): 0.1 10*3/uL (ref 0.0–0.4)
Eos: 1 %
HCV Ab: NONREACTIVE
HIV Screen 4th Generation wRfx: NONREACTIVE
Hematocrit: 36 % (ref 34.0–46.6)
Hemoglobin: 11.3 g/dL (ref 11.1–15.9)
Hepatitis B Surface Ag: NEGATIVE
Immature Grans (Abs): 0 10*3/uL (ref 0.0–0.1)
Immature Granulocytes: 0 %
Lymphocytes Absolute: 1.6 10*3/uL (ref 0.7–3.1)
Lymphs: 28 %
MCH: 25.1 pg — ABNORMAL LOW (ref 26.6–33.0)
MCHC: 31.4 g/dL — ABNORMAL LOW (ref 31.5–35.7)
MCV: 80 fL (ref 79–97)
Monocytes Absolute: 0.6 10*3/uL (ref 0.1–0.9)
Monocytes: 11 %
Neutrophils Absolute: 3.3 10*3/uL (ref 1.4–7.0)
Neutrophils: 60 %
Platelets: 273 10*3/uL (ref 150–450)
RBC: 4.51 x10E6/uL (ref 3.77–5.28)
RDW: 16.1 % — ABNORMAL HIGH (ref 11.7–15.4)
RPR Ser Ql: NONREACTIVE
Rh Factor: POSITIVE
Rubella Antibodies, IGG: 2.01 index (ref >0.99)
WBC: 5.6 10*3/uL (ref 3.4–10.8)

## 2022-08-14 LAB — PROTEIN / CREATININE RATIO, URINE
Creatinine, Urine: 139.4 mg/dL
Protein, Ur: 22.5 mg/dL
Protein/Creat Ratio: 161 mg/g creat (ref 0–200)

## 2022-08-14 LAB — HEMOGLOBIN A1C
Est. average glucose Bld gHb Est-mCnc: 120 mg/dL
Hgb A1c MFr Bld: 5.8 % — ABNORMAL HIGH (ref 4.8–5.6)

## 2022-08-14 LAB — HCV INTERPRETATION

## 2022-08-14 LAB — TSH: TSH: 1.34 u[IU]/mL (ref 0.450–4.500)

## 2022-08-15 LAB — CULTURE, OB URINE

## 2022-08-15 LAB — URINE CULTURE, OB REFLEX

## 2022-08-17 ENCOUNTER — Encounter: Payer: Self-pay | Admitting: Family Medicine

## 2022-08-18 ENCOUNTER — Telehealth: Payer: Self-pay

## 2022-08-18 DIAGNOSIS — R7309 Other abnormal glucose: Secondary | ICD-10-CM

## 2022-08-18 NOTE — Telephone Encounter (Addendum)
-----   Message from [name], MD sent at 08/14/2022 10:33 PM EST ----- Needs 2 hour please.  Notified pt results and the need for early testing.  Pt states that she will be able to come in on 08/25/22 for early 2 hr.    Kristine Johnson  08/18/22

## 2022-08-19 ENCOUNTER — Encounter: Payer: Self-pay | Admitting: *Deleted

## 2022-08-19 LAB — PANORAMA PRENATAL TEST FULL PANEL:PANORAMA TEST PLUS 5 ADDITIONAL MICRODELETIONS: FETAL FRACTION: 4.2

## 2022-08-19 NOTE — BH Specialist Note (Signed)
Integrated Behavioral Health via Telemedicine Visit  09/02/2022 [place] Lesieli Leinberger [id number]  Number of Integrated Behavioral Health Clinician visits: 1- Initial Visit  Session Start time: 0917   Session End time: 1011  Total time in minutes: 54   Referring Provider: Darron Doom, MD Patient/Family location: Home [facility] Provider location: [facility] [place] [facility]  All persons participating in visit: Patient Kristine Johnson and [place]   Types of Service: Individual psychotherapy and Video visit  I connected with Whisper Diondra Mcgregory and/or Nona Diondra Shimon's  n/a  via  Telephone or Geologist, engineering  (Video is Caregility application) and verified that I am speaking with the correct person using two identifiers. Discussed confidentiality: Yes   I discussed the limitations of telemedicine and the availability of in person appointments.  Discussed there is a possibility of technology failure and discussed alternative modes of communication if that failure occurs.  I discussed that engaging in this telemedicine visit, they consent to the provision of behavioral healthcare and the services will be billed under their insurance.  Patient and/or legal guardian expressed understanding and consented to Telemedicine visit: Yes   Presenting Concerns: Patient and/or family reports the following symptoms/concerns: Anxiety and lack of quality sleep,  increase with current pregnancy; attributes to lack of expected support from FOB and worry about high blood pressure, thyroid tumor and gestational diabetes. Pt copes by napping and watching shows; open to implementing additional self-coping strategy. Pt has good support network. Pt stopped taking Lexapro with positive pregnancy; prefers no medication at this time.  Duration of problem: Current pregnancy increase; Severity of problem: moderate  Patient  and/or Family's Strengths/Protective Factors: Social connections, Concrete supports in place (healthy food, safe environments, etc.), Sense of purpose, and Physical Health (exercise, healthy diet, medication compliance, etc.)  Goals Addressed: Patient will:  Reduce symptoms of: anxiety, insomnia, and stress   Increase knowledge and/or ability of: healthy habits and self-management skills   Demonstrate ability to: Increase healthy adjustment to current life circumstances  Progress towards Goals: Ongoing  Interventions: Interventions utilized:  Mindfulness or Psychologist, educational, Psychoeducation and/or Health Education, and Link to [employer] Standardized Assessments completed: Not Needed  Patient and/or Family Response: Patient agrees with treatment plan.   Assessment: Patient currently experiencing Adjustment disorder with anxious mood.   Patient may benefit from psychoeducation and brief therapeutic interventions regarding coping with symptoms of anxiety and current life stress .  Plan: Follow up with behavioral health clinician on : One month Behavioral recommendations:  -Continue taking prenatal vitamin daily as prescribed -CALM relaxation breathing exercise twice daily (morning; at bedtime with sleep sounds); as needed throughout the day. -Continue prioritizing healthy self-care (regular meals, adequate rest; allowing practical help from supportive friends and family)  -Read through information on After Visit Summary; use as needed Referral(s): [place] (In Clinic)  I discussed the assessment and treatment plan with the patient and/or parent/guardian. They were provided an opportunity to ask questions and all were answered. They agreed with the plan and demonstrated an understanding of the instructions.   They were advised to call back or seek an in-person evaluation if the symptoms worsen or if the condition fails to improve as  anticipated.  Garlan Fair, LCSW     08/13/2022    9:47 AM 11/13/2021    3:11 PM 12/30/2020    4:01 PM 11/04/2020    3:06 PM  Depression screen PHQ 2/9  Decreased  Interest 1 0 0 1  Down, Depressed, Hopeless 0 0 0 0  PHQ - 2 Score 1 0 0 1  Altered sleeping 3 1 0 0  Tired, decreased energy '3 1 1 1  '$ Change in appetite 1 0 0 0  Feeling bad or failure about yourself  0 0 0 0  Trouble concentrating 0 0 0 0  Moving slowly or fidgety/restless 0 0 0 0  Suicidal thoughts 0 0 0 0  PHQ-9 Score '8 2 1 2      '$ 08/13/2022    9:47 AM 11/13/2021    3:12 PM 12/30/2020    4:01 PM 11/04/2020    3:07 PM  GAD 7 : Generalized Anxiety Score  Nervous, Anxious, on Edge 1 0 0 0  Control/stop worrying 0 0 0 0  Worry too much - different things 0 0 0 1  Trouble relaxing 1 0 0 0  Restless 0 0 0 0  Easily annoyed or irritable 1 1 0 1  Afraid - awful might happen 1 0 0 0  Total GAD 7 Score 4 1 0 2

## 2022-08-23 LAB — HORIZON CUSTOM: REPORT SUMMARY: POSITIVE — AB

## 2022-08-24 ENCOUNTER — Encounter: Payer: Self-pay | Admitting: Family Medicine

## 2022-08-24 DIAGNOSIS — D563 Thalassemia minor: Secondary | ICD-10-CM | POA: Insufficient documentation

## 2022-08-25 ENCOUNTER — Telehealth: Payer: Self-pay | Admitting: Lactation Services

## 2022-08-25 ENCOUNTER — Other Ambulatory Visit: Payer: BC Managed Care – PPO

## 2022-08-25 DIAGNOSIS — R7309 Other abnormal glucose: Secondary | ICD-10-CM

## 2022-08-25 NOTE — Telephone Encounter (Signed)
Called and spoke with patient in regards to [employer].   She was aware based in My Chart.   Reviewed it is recommended that patient call [facility] at [phone] to schedule a Genetic Counseling Session via phone to discuss results.   Reviewed it is recommended that FOB be tested to see if he carries the gene. Reviewed Saliva kit can be picked up in the office at her next appointment and sent back to [facility].  Patient voiced understanding.

## 2022-08-25 NOTE — Telephone Encounter (Signed)
-----   Message from [name], MD sent at 08/24/2022  4:54 PM EST ----- Kristine Johnson, genetics referral.

## 2022-08-26 LAB — GLUCOSE TOLERANCE, 2 HOURS W/ 1HR
Glucose, 1 hour: 170 mg/dL (ref 70–179)
Glucose, 2 hour: 110 mg/dL (ref 70–152)
Glucose, Fasting: 93 mg/dL — ABNORMAL HIGH (ref 70–91)

## 2022-08-28 ENCOUNTER — Telehealth: Payer: Self-pay

## 2022-08-28 DIAGNOSIS — O24419 Gestational diabetes mellitus in pregnancy, unspecified control: Secondary | ICD-10-CM | POA: Insufficient documentation

## 2022-08-28 DIAGNOSIS — Z3480 Encounter for supervision of other normal pregnancy, unspecified trimester: Secondary | ICD-10-CM

## 2022-08-28 MED ORDER — ACCU-CHEK SOFTCLIX LANCETS MISC: 12 refills | Status: DC

## 2022-08-28 MED ORDER — ACCU-CHEK GUIDE VI STRP: ORAL_STRIP | 12 refills | Status: DC

## 2022-08-28 MED ORDER — ACCU-CHEK GUIDE W/DEVICE KIT: 1.0000 | PACK | Freq: Once | 0 refills | Status: AC

## 2022-08-28 NOTE — Telephone Encounter (Addendum)
-----   Message from [name], MD sent at 08/26/2022 10:13 AM EST ----- Has GDM--please refer  to D and N mgmt and give supplies   Called pt; results reviewed. First available diabetes ed appt scheduled for 09/08/22. Supplies sent and pt instructed to pick up.

## 2022-09-02 ENCOUNTER — Ambulatory Visit (INDEPENDENT_AMBULATORY_CARE_PROVIDER_SITE_OTHER): Payer: BC Managed Care – PPO | Admitting: Clinical

## 2022-09-02 DIAGNOSIS — F4322 Adjustment disorder with anxiety: Secondary | ICD-10-CM

## 2022-09-02 NOTE — Patient Instructions (Signed)
[facility] [address] [place], [place] [address] [phone] (main office) [phone] (Daley Gosse's office)  [email or web address]       BRAINSTORMING  Develop a Plan Goals: Provide a way to start conversation about your new life with a baby Assist parents in recognizing and using resources within their reach Help pave the way before birth for an easier period of transition afterwards.  Make a list of the following information to keep in a central location: Full name of Mom and Partner: _____________________________________________ Baby's full name and Date of Birth: ___________________________________________ Home Address: ___________________________________________________________ ________________________________________________________________________ Home Phone: ____________________________________________________________ Parents' cell numbers: _____________________________________________________ ________________________________________________________________________ Name and contact info for OB: ______________________________________________ Name and contact info for Pediatrician:________________________________________ Contact info for Lactation Consultants: ________________________________________  REST and SLEEP *You each need at least 4-5 hours of uninterrupted sleep every day. Write specific names and contact information.* How are you going to rest in the postpartum period? While partner's home? When partner returns to work? When you both return to work? Where will your baby sleep? Who is available to help during the day? Evening? Night? Who could move in for a period to help support you? What are some ideas to help you get enough  sleep? __________________________________________________________________________________________________________________________________________________________________________________________________________________________________________ NUTRITIOUS FOOD AND DRINK *Plan for meals before your baby is born so you can have healthy food to eat during the immediate postpartum period.* Who will look after breakfast? Lunch? Dinner? List names and contact information. Brainstorm quick, healthy ideas for each meal. What can you do before baby is born to prepare meals for the postpartum period? How can others help you with meals? Which grocery stores provide online shopping and delivery? Which restaurants offer take-out or delivery options? ______________________________________________________________________________________________________________________________________________________________________________________________________________________________________________________________________________________________________________________________________________________________________________________________________  CARE FOR MOM *It's important that mom is cared for and pampered in the postpartum period. Remember, the most important ways new mothers need care are: sleep, nutrition, gentle exercise, and time off.* Who can come take care of mom during this period? Make a list of people with their contact information. List some activities that make you feel cared for, rested, and energized? Who can make sure you have opportunities to do these things? Does mom have a space of her very own within your home that's just for her? Make a "Mama Cave" where she can be comfortable, rest, and renew herself  daily. ______________________________________________________________________________________________________________________________________________________________________________________________________________________________________________________________________________________________________________________________________________________________________________________________________    CARE FOR AND FEEDING BABY *Knowledgeable and encouraging people will offer the best support with regard to feeding your baby.* Educate yourself and choose the best feeding option for your baby. Make a list of people who will guide, support, and be a resource for you as your care for and feed your baby. (Friends that have breastfed or are currently breastfeeding, lactation consultants, breastfeeding support groups, etc.) Consider a postpartum doula. (These websites can give you information: dona.org & padanc.org) Seek out local breastfeeding resources like the breastfeeding support group at [facility] or [employer]. ______________________________________________________________________________________________________________________________________________________________________________________________________________________________________________________________________________________________________________________________________________________________________________________________________  CHORES AND ERRANDS Who can help with a thorough cleaning before baby is born? Make a list of people who will help with housekeeping and chores, like laundry, light cleaning, dishes, bathrooms, etc. Who can run some errands for you? What can you do to make sure you are stocked with basic supplies before baby is born? Who is going to do the  shopping? ______________________________________________________________________________________________________________________________________________________________________________________________________________________________________________________________________________________________________________________________________________________________________________________________________     Family Adjustment *Nurture yourselves.it helps parents be more loving and allows for better bonding with their child.* What sorts of things do   you and partner enjoy doing together? Which activities help you to connect and strengthen your relationship? Make a list of those things. Make a list of people whom you trust to care for your baby so you can have some time together as a couple. What types of things help partner feel connected to Mom? Make a list. What needs will partner have in order to bond with baby? Other children? Who will care for them when you go into labor and while you are in the hospital? Think about what the needs of your older children might be. Who can help you meet those needs? In what ways are you helping them prepare for bringing baby home? List some specific strategies you have for family adjustment. _______________________________________________________________________________________________________________________________________________________________________________________________________________________________________________________________________________________________________________________________________________  SUPPORT *Someone who can empathize with experiences normalizes your problems and makes them more bearable.* Make a list of other friends, neighbors, and/or co-workers you know with infants (and small children, if applicable) with whom you can connect. Make a list of local or online support groups, mom groups, etc. in which you can be  involved. ______________________________________________________________________________________________________________________________________________________________________________________________________________________________________________________________________________________________________________________________________________________________________________________________________  Childcare Plans Investigate and plan for childcare if mom is returning to work. Talk about mom's concerns about her transition back to work. Talk about partner's concerns regarding this transition.  Mental Health *Your mental health is one of the highest priorities for a pregnant or postpartum mom.* 1 in 5 women experience anxiety and/or depression from the time of conception through the first year after birth. Postpartum Mood Disorders are the #1 complication of pregnancy and childbirth and the suffering experienced by these mothers is not necessary! These illnesses are temporary and respond well to treatment, which often includes self-care, social support, talk therapy, and medication when needed. Women experiencing anxiety and depression often say things like: "I'm supposed to be happy.why do I feel so sad?", "Why can't I snap out of it?", "I'm having thoughts that scare me." There is no need to be embarrassed if you are feeling these symptoms: Overwhelmed, anxious, angry, sad, guilty, irritable, hopeless, exhausted but can't sleep You are NOT alone. You are NOT to blame. With help, you WILL be well. Where can I find help? Medical professionals such as your OB, midwife, gynecologist, family practitioner, primary care provider, pediatrician, or mental health providers; [facility] support groups: Feelings After Birth, Breastfeeding Support Group, Baby and Me Group, and Fit 4 Two exercise classes. You have permission to ask for help. It will confirm your feelings, validate your experiences,  share/learn coping strategies, and gain support and encouragement as you heal. You are important! BRAINSTORM Make a list of local resources, including resources for mom and for partner. Identify support groups. Identify people to call late at night - include names and contact info. Talk with partner about perinatal mood and anxiety disorders. Talk with your OB, midwife, and doula about baby blues and about perinatal mood and anxiety disorders. Talk with your pediatrician about perinatal mood and anxiety disorders.   Support & Sanity Savers   What do you really need?  Basics In preparing for a new baby, many expectant parents spend hours shopping for baby clothes, decorating the nursery, and deciding which car seat to buy. Yet most don't think much about what the reality of parenting a newborn will be like, and what they need to make it through that. So, here is the advice of experienced parents. We know you'll read this, and think "they're exaggerating, I don't really need that." Just trust us on these, OK?   Plan for all of this, and if it turns out you don't need it, come back and teach us how you did it!  Must-Haves (Once baby's survival needs are met, make sure you attend to your own survival needs!) Sleep An average newborn sleeps 16-18 hours per day, over 6-7 sleep periods, rarely more than three hours at a time. It is normal and healthy for a newborn to wake throughout the night... but really hard on parents!! Naps. Prioritize sleep above any responsibilities like: cleaning house, visiting friends, running errands, etc.  Sleep whenever baby sleeps. If you can't nap, at least have restful times when baby eats. The more rest you get, the more patient you will be, the more emotionally stable, and better at solving problems.  Food You may not have realized it would be difficult to eat when you have a newborn. Yet, when we talk to countless new parents, they say things like "it may be 2:00 pm  when I realize I haven't had breakfast yet." Or "every time we sit down to dinner, baby needs to eat, and my food gets cold, so I don't bother to eat it." Finger food. Before your baby is born, stock up with one months' worth of food that: 1) you can eat with one hand while holding a baby, 2) doesn't need to be prepped, 3) is good hot or cold, 4) doesn't spoil when left out for a few hours, and 5) you like to eat. Think about: nuts, dried fruit, Clif bars, pretzels, jerky, gogurt, baby carrots, apples, bananas, crackers, cheez-n-crackers, string cheese, hot pockets or frozen burritos to microwave, garden burgers and breakfast pastries to put in the toaster, yogurt drinks, etc. Restaurant Menus. Make lists of your favorite restaurants & menu items. When family/friends want to help, you can give specific information without much thought. They can either bring you the food or send gift cards for just the right meals. Freezer Meals.  Take some time to make a few meals to put in the freezer ahead of time.  Easy to freeze meals can be anything such as soup, lasagna, chicken pie, or spaghetti sauce. Set up a Meal Schedule.  Ask friends and family to sign up to bring you meals during the first few weeks of being home. (It can be passed around at baby showers!) You have no idea how helpful this will be until you are in the throes of parenting.  www.takethemameal.com is a great website to check out. Emotional Support Know who to call when you're stressed out. Parenting a newborn is very challenging work. There are times when it totally overwhelms your normal coping abilities. EVERY NEW PARENT NEEDS TO HAVE A PLAN FOR WHO TO CALL WHEN THEY JUST CAN'T COPE ANY MORE. (And it has to be someone other than the baby's other parent!) Before your baby is born, come up with at least one person you can call for support - write their phone number down and post it on the refrigerator. Anxiety & Sadness. Baby blues are normal after  pregnancy; however, there are more severe types of anxiety & sadness which can occur and should not be ignored.  They are always treatable, but you have to take the first step by reaching out for help. [facility] offers a "Mom Talk" group which meets every Tuesday from 10 am - 11 am.  This group is for new moms who need support and connection after their babies are born.  Call [phone].  Really, Really   Helpful (Plan for them! Make sure these happen often!!) Physical Support with Taking Care of Yourselves Asking friends and family. Before your baby is born, set up a schedule of people who can come and visit and help out (or ask a friend to schedule for you). Any time someone says "let me know what I can do to help," sign them up for a day. When they get there, their job is not to take care of the baby (that's your job and your joy). Their job is to take care of you!  Postpartum doulas. If you don't have anyone you can call on for support, look into postpartum doulas:  professionals at helping parents with caring for baby, caring for themselves, getting breastfeeding started, and helping with household tasks. www.padanc.org is a helpful website for learning about doulas in our area. Peer Support / Parent Groups Why: One of the greatest ideas for new parents is to be around other new parents. Parent groups give you a chance to share and listen to others who are going through the same season of life, get a sense of what is normal infant development by watching several babies learn and grow, share your stories of triumph and struggles with empathetic ears, and forgive your own mistakes when you realize all parents are learning by trial and error. Where to find: There are many places you can meet other new parents throughout our community.  [facility] offers the following classes for new moms and their little ones:  Baby and Me (Birth to Crawling) and Breastfeeding Support Group. Go to  [email or web address] or call [phone] for more information. Time for your Relationship It's easy to get so caught up in meeting baby's immediate needs that it's hard to find time to connect with your partner, and meet the needs of your relationship. It's also easy to forget what "quality time with your partner" actually looks like. If you take your baby on a date, you'd be amazed how much of your couple time is spent feeding the baby, diapering the baby, admiring the baby, and talking about the baby. Dating: Try to take time for just the two of you. Babysitter tip: Sometimes when moms are breastfeeding a newborn, they find it hard to figure out how to schedule outings around baby's unpredictable feeding schedules. Have the babysitter come for a three hour period. When she comes over, if baby has just eaten, you can leave right away, and come back in two hours. If baby hasn't fed recently, you start the date at home. Once baby gets hungry and gets a good feeding in, you can head out for the rest of your date time. Date Nights at Home: If you can't get out, at least set aside one evening a week to prioritize your relationship: whenever baby dozes off or doesn't have any immediate needs, spend a little time focusing on each other. Potential conflicts: The main relationship conflicts that come up for new parents are: issues related to sexuality, financial stresses, a feeling of an unfair division of household tasks, and conflicts in parenting styles. The more you can work on these issues before baby arrives, the better!  Fun and Frills (Don't forget these. and don't feel guilty for indulging in them!) Everyone has something in life that is a fun little treat that they do just for themselves. It may be: reading the morning paper, or going for a daily jog, or having coffee with a friend once a week, or going to a   movie on Friday nights, or fine chocolates, or bubble baths, or curling up with a good  book. Unless you do fun things for yourself every now and then, it's hard to have the energy for fun with your baby. Whatever your "special" treats are, make sure you find a way to continue to indulge in them after your baby is born. These special moments can recharge you, and allow you to return to baby with a new joy   PERINATAL MOOD DISORDERS: MATERNAL MENTAL HEALTH FROM CONCEPTION THROUGH THE POSTPARTUM PERIOD   _________________________________________Emergency and Crisis Resources If you are an imminent risk to self or others, are experiencing intense personal distress, and/or have noticed significant changes in activities of daily living, call:  911 [facility]: [phone]  [address], [place], [place], [address] Mobile Crisis: 877-626-1772 National Suicide Hotline: 988 Or visit the following crisis centers: Local Emergency Departments [facility]: [address], [place]  [phone]. Hours: 8:30AM-5PM. Insurance Accepted: Medicaid, Medicare, and Uninsured.  [facility]:  [address], [place]  Mon-Friday 8am-3pm, [phone]                                                                                  ___________ Non-Crisis Resources To identify specific providers that are covered by your insurance, contact your insurance company or local agencies:  [facility]: 1-800-256-2452 [facility]: 888-581-9988 [facility]: 1-800-939-5911 Postpartum Support International- Warm-line: 1-800-944-4773                                                      __Outpatient Therapy and Medication Management   Providers:  [facility]: [phone] Hours: 9AM-5PM  Insurance Accepted: AARP, Aetna, BCBS, Cigna, [employer], Humana, Medicare  [facility] [phone] Hours: 8AM-5:30PM  nsurance Accepted: All insurances EXCEPT AARP, Aetna,  [employer], and Humana [facility]: [phone] Hours: 8AM-8PM Insurance Accepted: Aetna, BCBS, Cigna, [employer], Medicaid, Medicare, Uninsured [facility]: [phone] [facility]: [phone] Hours: 8:30AM-7PM Insurance Accepted: Aetna, BCBS, Medicaid, Medicare, Tricare, United Healthcare [facility]:  [phone]   Hours:9AM-5PM Insurance Accepted:  Aetna, BCBS, [facility], Medicaid, United Health Care  [facility]: [phone] Hours: 9AM-5:30PM Insurance Accepted: AARP, Aetna, BCBS, Cigna, and Medicaid, Medicare, United Health Care [facility]:  [phone] Hours: 9am-5pm Insurance Accepted: BCBS; they do not accept Medicaid/Medicare [facility]: [phone] Hours: 9am-9pm Insurance Accepted: All major insurance including Medicaid and Medicare [facility]: [phone] Hours: 9AM- 5PM Insurance Accepted: All insurances EXCEPT Medicaid and Medicare. [facility]: [phone]   ____________                                                                       Parenting Support Groups [facility]: [phone] [facility]:  [phone] Family Support Network: (support for children in the NICU and/or with special needs), [phone]   ___________                                                                 Mental Health Support Groups Mental Health Association: [phone]    _____________                                                                                  Online Resources Postpartum Support International: http://www.postpartum.net/  800-944-4PPD 2Moms Supporting Moms:  www.momssupportingmoms.net     

## 2022-09-03 ENCOUNTER — Ambulatory Visit: Payer: BC Managed Care – PPO

## 2022-09-08 ENCOUNTER — Ambulatory Visit (INDEPENDENT_AMBULATORY_CARE_PROVIDER_SITE_OTHER): Payer: BC Managed Care – PPO | Admitting: Registered"

## 2022-09-08 ENCOUNTER — Encounter: Payer: BC Managed Care – PPO | Attending: Family Medicine | Admitting: Registered"

## 2022-09-08 ENCOUNTER — Other Ambulatory Visit: Payer: BC Managed Care – PPO

## 2022-09-08 ENCOUNTER — Other Ambulatory Visit: Payer: Self-pay

## 2022-09-08 DIAGNOSIS — O24419 Gestational diabetes mellitus in pregnancy, unspecified control: Secondary | ICD-10-CM | POA: Diagnosis not present

## 2022-09-08 DIAGNOSIS — Z3A15 15 weeks gestation of pregnancy: Secondary | ICD-10-CM

## 2022-09-08 NOTE — Progress Notes (Signed)
Patient was seen for Gestational Diabetes self-management on 09/08/22  Start time 1145 and End time 1222   Estimated due date: 02/26/23; [redacted]w[redacted]d  Clinical: Medications: iron, albuterol, prenatal  Medical History: GDM, prediabetes Labs:  Component Ref Range & Units 2 wk ago  Glucose, Fasting 70 - 91 mg/dL 93 High   Glucose, 1 hour 70 - 179 mg/dL 170  Glucose, 2 hour 70 - 152 mg/dL 110   Lab Results  Component Value Date   HGBA1C 5.8 (H) 08/13/2022   Dietary and Lifestyle History: Pt states she has been having cravings for fast food (bojangles), but has been trying to eat healthy and includes vegetables in diet most days. Pt states she has been really enjoying salads lately.  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 Pt states on a good day she will get 2 hour-long naps and sleeps about 4 hours during the night.  Dairy: pt states she does not drink milk, but most days has [place] yogurt parfait for breakfast.   Physical Activity: not assessed Stress: not assessed Sleep: 6-7 hrs (4-5 hrs night, 2 hrs during day)  24 hr Recall: not assessed First Meal:  Snack: Second meal: Snack: Third meal: Snack: Beverages:  NUTRITION INTERVENTION  Nutrition education (E-1) on the following topics:   Initial Follow-up  [x]  []  Definition of Gestational Diabetes (also discussed A1c) [x]  []  Why dietary management is important in controlling blood glucose [x]  []  Effects each nutrient has on blood glucose levels [x]  []  Simple carbohydrates vs complex carbohydrates []  []  Fluid intake [x]  []  Creating a balanced meal plan [x]  []  Carbohydrate counting (basics) [x]  []  When to check blood glucose levels []  []  Proper blood glucose monitoring techniques []  []  Effect of stress and stress reduction techniques  []  []  Exercise effect on blood glucose levels, appropriate exercise during pregnancy []  []  Importance of limiting caffeine  and abstaining from alcohol and smoking []  []  Medications used for blood sugar control during pregnancy []  []  Hypoglycemia and rule of 15 []  []  Postpartum self care  Patient has a meter prior to visit. Patient was waiting for this visit to start testing pre breakfast and 2 hours after each meal.  Patient instructed to monitor glucose levels: FBS: 60 - ? 95 mg/dL; 2 hour: ? 120 mg/dL  Patient received handouts: Nutrition Diabetes and Pregnancy Carbohydrate Counting List Iron & Calcium food handouts  Patient will be seen for follow-up in 2 weeks

## 2022-09-10 ENCOUNTER — Ambulatory Visit (INDEPENDENT_AMBULATORY_CARE_PROVIDER_SITE_OTHER): Payer: BC Managed Care – PPO | Admitting: Obstetrics and Gynecology

## 2022-09-10 ENCOUNTER — Other Ambulatory Visit: Payer: Self-pay

## 2022-09-10 ENCOUNTER — Other Ambulatory Visit (HOSPITAL_COMMUNITY)
Admission: RE | Admit: 2022-09-10 | Discharge: 2022-09-10 | Disposition: A | Discharge status: Home / Self Care | Payer: BC Managed Care – PPO | Source: Ambulatory Visit | Attending: Obstetrics and Gynecology | Admitting: Obstetrics and Gynecology

## 2022-09-10 VITALS — BP 112/72 | HR 84 | Wt 229.3 lb

## 2022-09-10 DIAGNOSIS — N898 Other specified noninflammatory disorders of vagina: Secondary | ICD-10-CM | POA: Diagnosis present

## 2022-09-10 DIAGNOSIS — Z6841 Body Mass Index (BMI) 40.0 and over, adult: Secondary | ICD-10-CM

## 2022-09-10 DIAGNOSIS — O24410 Gestational diabetes mellitus in pregnancy, diet controlled: Secondary | ICD-10-CM

## 2022-09-10 DIAGNOSIS — D563 Thalassemia minor: Secondary | ICD-10-CM

## 2022-09-10 DIAGNOSIS — O10912 Unspecified pre-existing hypertension complicating pregnancy, second trimester: Secondary | ICD-10-CM

## 2022-09-10 DIAGNOSIS — Z3A15 15 weeks gestation of pregnancy: Secondary | ICD-10-CM

## 2022-09-10 DIAGNOSIS — O10919 Unspecified pre-existing hypertension complicating pregnancy, unspecified trimester: Secondary | ICD-10-CM | POA: Insufficient documentation

## 2022-09-10 DIAGNOSIS — Z3480 Encounter for supervision of other normal pregnancy, unspecified trimester: Secondary | ICD-10-CM

## 2022-09-10 NOTE — Progress Notes (Signed)
   PRENATAL VISIT NOTE  Subjective:  Kristine Johnson is a 37 y.o. G2P0010 at [redacted]w[redacted]d being seen today for ongoing prenatal care.  She is currently monitored for the following issues for this high-risk pregnancy and has Dysmenorrhea; Low grade squamous intraepithelial lesion (LGSIL) on cervical Pap smear; Uterine leiomyoma; Supervision of other normal pregnancy, antepartum; Prediabetes; Alpha thalassemia silent carrier; Gestational diabetes mellitus (GDM), antepartum; BMI 40.0-44.9, adult ([place]); and Chronic hypertension in pregnancy on their problem list.  Patient doing well with no acute concerns today. She reports no complaints.  Contractions: Not present. Vag. Bleeding: None.  Movement: Absent. Denies leaking of fluid.   The following portions of the patient's history were reviewed and updated as appropriate: allergies, current medications, past family history, past medical history, past social history, past surgical history and problem list. Problem list updated.  Objective:   Vitals:   09/10/22 1010  BP: 112/72  Pulse: 84  Weight: 229 lb 4.8 oz (104 kg)    Fetal Status: Fetal Heart Rate (bpm): 157 Fundal Height: 15 cm Movement: Absent     General:  Alert, oriented and cooperative. Patient is in no acute distress.  Skin: Skin is warm and dry. No rash noted.   Cardiovascular: Normal heart rate noted  Respiratory: Normal respiratory effort, no problems with respiration noted  Abdomen: Soft, gravid, appropriate for gestational age.  Pain/Pressure: Present     Pelvic: Cervical exam deferred        Extremities: Normal range of motion.  Edema: None  Mental Status:  Normal mood and affect. Normal behavior. Normal judgment and thought content.   Assessment and Plan:  Pregnancy: G2P0010 at [redacted]w[redacted]d  1. Vaginal discharge Pt will perform self swab today  - Cervicovaginal ancillary only( [place])  2. Supervision of other normal pregnancy, antepartum Continue routine prenatal  care  - AFP, Serum, Open Spina Bifida  3. [redacted] weeks gestation of pregnancy   4. Diet controlled gestational diabetes mellitus (GDM), antepartum FBS: 90 PPBS: highest reading 122  So far pt appears to have decent control with diet only.  Pt advised to bring in blood sugar sheets each visit  5. Alpha thalassemia silent carrier   6. BMI 40.0-44.9, adult ([place])   7. Chronic hypertension in pregnancy Pt continues with procardia with good response, she is compliant with baby ASA  Preterm labor symptoms and general obstetric precautions including but not limited to vaginal bleeding, contractions, leaking of fluid and fetal movement were reviewed in detail with the patient.  Please refer to After Visit Summary for other counseling recommendations.   Return in about 4 weeks (around 10/08/2022) for [facility], in person.   Lynnda Shields, MD Faculty Attending Center for [facility]

## 2022-09-10 NOTE — Progress Notes (Deleted)
   PRENATAL VISIT NOTE  Subjective:  Kristine Johnson is a 37 y.o. G2P0010 at [redacted]w[redacted]d being seen today for ongoing prenatal care.  She is currently monitored for the following issues for this high-risk pregnancy and has Dysmenorrhea; Low grade squamous intraepithelial lesion (LGSIL) on cervical Pap smear; Uterine leiomyoma; Supervision of other normal pregnancy, antepartum; Prediabetes; Alpha thalassemia silent carrier; Gestational diabetes mellitus (GDM), antepartum; and BMI 40.0-44.9, adult ([place]) on their problem list.  Patient doing well with no acute concerns today. She reports no complaints.  Contractions: Not present. Vag. Bleeding: None.  Movement: Absent. ***Denies leaking of fluid.   The following portions of the patient's history were reviewed and updated as appropriate: allergies, current medications, past family history, past medical history, past social history, past surgical history and problem list. Problem list updated.  Objective:   Vitals:   09/10/22 1010  BP: 112/72  Pulse: 84  Weight: 229 lb 4.8 oz (104 kg)    Fetal Status: Fetal Heart Rate (bpm): 157   Movement: Absent     General:  Alert, oriented and cooperative. Patient is in no acute distress.  Skin: Skin is warm and dry. No rash noted.   Cardiovascular: Normal heart rate noted  Respiratory: Normal respiratory effort, no problems with respiration noted  Abdomen: Soft, ***gravid, appropriate for gestational age.  Pain/Pressure: Present     Pelvic: {Blank single:19197::"Cervical exam performed","Cervical exam deferred"}        Extremities: Normal range of motion.  Edema: None  Mental Status:  Normal mood and affect. Normal behavior. Normal judgment and thought content.   Assessment and Plan:  Pregnancy: G2P0010 at [redacted]w[redacted]d  1. Vaginal discharge *** - Cervicovaginal ancillary only( [place])  2. Supervision of other normal pregnancy, antepartum ***  3. [redacted] weeks gestation of pregnancy ***  4.  Diet controlled gestational diabetes mellitus (GDM), antepartum ***  5. Alpha thalassemia silent carrier ***  6. BMI 40.0-44.9, adult ([facility]) ***  {Blank single:19197::"Term","Preterm"} labor symptoms and general obstetric precautions including but not limited to vaginal bleeding, contractions, leaking of fluid and fetal movement were reviewed in detail with the patient.  Please refer to After Visit Summary for other counseling recommendations.   Return in about 4 weeks (around 10/08/2022) for [facility], in person.   Lynnda Shields, MD Faculty Attending Center for [facility]

## 2022-09-11 LAB — CERVICOVAGINAL ANCILLARY ONLY
Bacterial Vaginitis (gardnerella): NEGATIVE
Candida Glabrata: NEGATIVE
Candida Vaginitis: NEGATIVE
Chlamydia: NEGATIVE
Comment: NEGATIVE
Comment: NEGATIVE
Comment: NEGATIVE
Comment: NEGATIVE
Comment: NEGATIVE
Comment: NORMAL
Neisseria Gonorrhea: NEGATIVE
Trichomonas: NEGATIVE

## 2022-09-12 LAB — AFP, SERUM, OPEN SPINA BIFIDA
AFP MoM: 0.92
AFP Value: 24.9 ng/mL
Gest. Age on Collection Date: 15.6 weeks
Maternal Age At EDD: 34.9 yr
OSBR Risk 1 IN: 10000
Test Results:: NEGATIVE
Weight: 229 [lb_av]

## 2022-09-17 NOTE — Progress Notes (Signed)
Alert received through NCNotify system on 08/26/22 and 09/09/22 as follows: Patient identified as having high BP reading (136/90) on 08/13/2022 with no recent follow-up.  Per chart review patient has been regularly checking BP and logging into Babyscripts with normal values. Also seen following last alert on 09/10/22 with normal BP in office.   Babyscripts values:   No action needed.  Annabell Howells, RN 09/17/2022  11:38 AM

## 2022-09-21 NOTE — BH Specialist Note (Signed)
Integrated Behavioral Health via Telemedicine Visit  09/30/2022 [name] [id number]  Number of Integrated Behavioral Health Clinician visits: 2- Second Visit  Session Start time: 1316   Session End time: 1343  Total time in minutes: 27   Referring Provider: Tinnie Gens, MD Patient/Family location: Home [facility] Provider location: Center for [facility]  All persons participating in visit: Patient Kristine Johnson and [facility] Tekeisha Hakim   Types of Service: Individual psychotherapy and Video visit  I connected with Kritika Diondra Bonn and/or Wilmarie Diondra Cajas's  n/a  via  Telephone or Engineer, civil (consulting)  (Video is Caregility application) and verified that I am speaking with the correct person using two identifiers. Discussed confidentiality: Yes   I discussed the limitations of telemedicine and the availability of in person appointments.  Discussed there is a possibility of technology failure and discussed alternative modes of communication if that failure occurs.  I discussed that engaging in this telemedicine visit, they consent to the provision of behavioral healthcare and the services will be billed under their insurance.  Patient and/or legal guardian expressed understanding and consented to Telemedicine visit: Yes   Presenting Concerns: Patient and/or family reports the following symptoms/concerns: Noticing headaches when hungry and discomfort when sleeping; increasing water and walking daily, using relaxation breathing as needed. Some concern regarding finding childcare postpartum.  Duration of problem: Current pregnancy; Severity of problem: mild  Patient and/or Family's Strengths/Protective Factors: Social connections, Concrete supports in place (healthy food, safe environments, etc.), Sense of purpose, and Physical Health (exercise, healthy diet, medication compliance,  etc.)  Goals Addressed: Patient will:  Reduce symptoms of: anxiety, insomnia, and stress   Increase knowledge and/or ability of: healthy habits and stress reduction   Demonstrate ability to: Increase healthy adjustment to current life circumstances  Progress towards Goals: Ongoing  Interventions: Interventions utilized:  Link to [employer] and Supportive Reflection Standardized Assessments completed: GAD-7 and PHQ 9  Patient and/or Family Response: Patient agrees with treatment plan.   Assessment: Patient currently experiencing Adjustment disorder with anxious mood.   Patient may benefit from continued therapeutic interventions.  Plan: Follow up with behavioral health clinician on : Three months Behavioral recommendations:  -Continue prenatal vitamin daily -Continue daily self-coping strategies (outdoor walks, drinking water to thirst, relaxation breathing exercises) -Continue prioritizing healthy self-care (regular meals, adequate rest; allowing practical help from supportive friends and family)  Referral(s): Integrated Art gallery manager (In Clinic) and [employer]:  childcare  I discussed the assessment and treatment plan with the patient and/or parent/guardian. They were provided an opportunity to ask questions and all were answered. They agreed with the plan and demonstrated an understanding of the instructions.   They were advised to call back or seek an in-person evaluation if the symptoms worsen or if the condition fails to improve as anticipated.  [name] Marise Knapper, LCSW     09/30/2022    1:25 PM 08/13/2022    9:47 AM 11/13/2021    3:11 PM 12/30/2020    4:01 PM 11/04/2020    3:06 PM  Depression screen PHQ 2/9  Decreased Interest 1 1 0 0 1  Down, Depressed, Hopeless 0 0 0 0 0  PHQ - 2 Score 1 1 0 0 1  Altered sleeping 0 3 1 0 0  Tired, decreased energy Change in appetite 0 1 0 0 0  Feeling bad or failure about yourself  0 0 0  0  0  Trouble concentrating 0 0 0 0 0  Moving slowly or fidgety/restless 0 0 0 0 0  Suicidal thoughts 0 0 0 0 0  PHQ-9 Score 2 8 2 1 2       09/30/2022    1:27 PM 08/13/2022    9:47 AM 11/13/2021    3:12 PM 12/30/2020    4:01 PM  GAD 7 : Generalized Anxiety Score  Nervous, Anxious, on Edge 1 1 0 0  Control/stop worrying 1 0 0 0  Worry too much - different things 0 0 0 0  Trouble relaxing 0 1 0 0  Restless 0 0 0 0  Easily annoyed or irritable 1 1 1  0  Afraid - awful might happen 1 1 0 0  Total GAD 7 Score 4 4 1  0

## 2022-09-22 ENCOUNTER — Other Ambulatory Visit: Payer: Self-pay

## 2022-09-22 ENCOUNTER — Encounter: Payer: BC Managed Care – PPO | Attending: Family Medicine | Admitting: Registered"

## 2022-09-22 ENCOUNTER — Ambulatory Visit (INDEPENDENT_AMBULATORY_CARE_PROVIDER_SITE_OTHER): Payer: BC Managed Care – PPO | Admitting: Registered"

## 2022-09-22 DIAGNOSIS — O24419 Gestational diabetes mellitus in pregnancy, unspecified control: Secondary | ICD-10-CM | POA: Insufficient documentation

## 2022-09-22 DIAGNOSIS — Z3A17 17 weeks gestation of pregnancy: Secondary | ICD-10-CM

## 2022-09-22 DIAGNOSIS — O24410 Gestational diabetes mellitus in pregnancy, diet controlled: Secondary | ICD-10-CM

## 2022-09-22 NOTE — Progress Notes (Signed)
Patient was seen for Gestational Diabetes self-management on 09/08/22  Start time 1613 and End time 1753   Estimated due date: 02/26/23; [redacted]w[redacted]d  Clinical: Medications: iron, albuterol, prenatal  Medical History: GDM, prediabetes Labs:  Component Ref Range & Units 2 wk ago  Glucose, Fasting 70 - 91 mg/dL 93 High   Glucose, 1 hour 70 - 179 mg/dL 170  Glucose, 2 hour 70 - 152 mg/dL 110   Lab Results  Component Value Date   HGBA1C 5.8 (H) 08/13/2022     Dietary and Lifestyle History: Pt states does not eat non-starchy vegetables daily, pinto beans occasionally. Pt states she likes fruit for snacks and started having vegetables for a snack 2x/week. Pt states she has a hard time with meal prep because has an issue with handling raw meat. Pt states her sister has offered to help her meal prep. Pt states at lunch time she is working from home and wants to just get something quick, and lately has been craving subway sandwiches.  Pt states she increased water intake to at least 60 oz per day.   Is starting to sleep more at night and less during the day.  Pt states she is concerned about taking medication in pregnancy, not sure how affects baby and also is concerned that she would have to continue taking after delivery.  Physical Activity: at least 4x/week 30-45 min, and increased moving around the house Stress: not assessed Sleep: 6-7 hrs   24 hr Recall:  First Meal: banana nut muffin, vanilla Greek yogurt Snack: none Second meal: frozen chicken patty sandwich, water Snack: gold fish, fruit trail mix bar Third meal: chicken, corn, mashed potatoes Snack: none Beverages: water, sugar free body armour or Gatorade  NUTRITION INTERVENTION  Nutrition education (E-1) on the following topics:   Initial Follow-up  [x]  [x]  Definition of Gestational Diabetes (also discussed A1c) reviewed changes to expect in 3rd trimester. [x]  []  Why dietary management is important in controlling blood  glucose [x]  []  Effects each nutrient has on blood glucose levels [x]  []  Simple carbohydrates vs complex carbohydrates []  [x]  Fluid intake [x]  []  Creating a balanced meal plan [x]  []  Carbohydrate counting (basics) [x]  []  When to check blood glucose levels []  [x]  Proper blood glucose monitoring techniques []  [x]  Effect of stress and stress reduction techniques  []  [x]  Exercise effect on blood glucose levels, appropriate exercise during pregnancy []  [x]  Importance of limiting caffeine and abstaining from alcohol and smoking []  [x]  Medications used for blood sugar control during pregnancy []  [x]  Hypoglycemia and rule of 15 []  [x]  Postpartum self care  Goals: Improve (lunch) meals: Have sister help with meal prep. Increase snacks, include protein with snacks Continue exercise Look into prenatal yoga   Patient instructed to monitor glucose levels: FBS: 60 - ? 95 mg/dL; 2 hour: ? 120 mg/dL  Patient received handouts: none  Patient will be seen for follow-up in 8 weeks

## 2022-09-22 NOTE — Patient Instructions (Addendum)
Improve (lunch) meals: Have sister help with meal prep. Increase snacks, include protein with snacks Continue exercise Look into prenatal yoga

## 2022-09-30 ENCOUNTER — Ambulatory Visit (INDEPENDENT_AMBULATORY_CARE_PROVIDER_SITE_OTHER): Payer: BC Managed Care – PPO | Admitting: Clinical

## 2022-09-30 DIAGNOSIS — F4322 Adjustment disorder with anxiety: Secondary | ICD-10-CM | POA: Diagnosis not present

## 2022-09-30 NOTE — Patient Instructions (Signed)
[facility] [address] [place], [place] [address] [phone] (main office) [phone] (Willey Due's office)   [place] [employer]  (Childcare options, Early childcare development, etc.) [email or web address]   [facility] [facility] Search Engine  https://[email or web address].ncdhhs.gov/childcaresearch  

## 2022-10-02 ENCOUNTER — Ambulatory Visit: Payer: BC Managed Care – PPO | Admitting: *Deleted

## 2022-10-02 ENCOUNTER — Encounter: Payer: Self-pay | Admitting: *Deleted

## 2022-10-02 ENCOUNTER — Ambulatory Visit: Payer: BC Managed Care – PPO | Attending: Family Medicine

## 2022-10-02 ENCOUNTER — Other Ambulatory Visit: Payer: Self-pay | Admitting: *Deleted

## 2022-10-02 VITALS — BP 116/62 | HR 90

## 2022-10-02 DIAGNOSIS — Z3689 Encounter for other specified antenatal screening: Secondary | ICD-10-CM

## 2022-10-02 DIAGNOSIS — O99212 Obesity complicating pregnancy, second trimester: Secondary | ICD-10-CM | POA: Diagnosis not present

## 2022-10-02 DIAGNOSIS — D251 Intramural leiomyoma of uterus: Secondary | ICD-10-CM | POA: Insufficient documentation

## 2022-10-02 DIAGNOSIS — Z363 Encounter for antenatal screening for malformations: Secondary | ICD-10-CM | POA: Diagnosis not present

## 2022-10-02 DIAGNOSIS — Z3A19 19 weeks gestation of pregnancy: Secondary | ICD-10-CM | POA: Insufficient documentation

## 2022-10-02 DIAGNOSIS — O10912 Unspecified pre-existing hypertension complicating pregnancy, second trimester: Secondary | ICD-10-CM

## 2022-10-02 DIAGNOSIS — Z3480 Encounter for supervision of other normal pregnancy, unspecified trimester: Secondary | ICD-10-CM

## 2022-10-02 DIAGNOSIS — O24419 Gestational diabetes mellitus in pregnancy, unspecified control: Secondary | ICD-10-CM | POA: Insufficient documentation

## 2022-10-02 DIAGNOSIS — D259 Leiomyoma of uterus, unspecified: Secondary | ICD-10-CM

## 2022-10-02 DIAGNOSIS — O3412 Maternal care for benign tumor of corpus uteri, second trimester: Secondary | ICD-10-CM | POA: Insufficient documentation

## 2022-10-08 ENCOUNTER — Other Ambulatory Visit: Payer: Self-pay

## 2022-10-08 ENCOUNTER — Encounter: Payer: Self-pay | Admitting: Obstetrics and Gynecology

## 2022-10-08 ENCOUNTER — Ambulatory Visit (INDEPENDENT_AMBULATORY_CARE_PROVIDER_SITE_OTHER): Payer: BC Managed Care – PPO | Admitting: Obstetrics and Gynecology

## 2022-10-08 VITALS — BP 125/73 | HR 96 | Wt 231.1 lb

## 2022-10-08 DIAGNOSIS — D259 Leiomyoma of uterus, unspecified: Secondary | ICD-10-CM

## 2022-10-08 DIAGNOSIS — O10912 Unspecified pre-existing hypertension complicating pregnancy, second trimester: Secondary | ICD-10-CM

## 2022-10-08 DIAGNOSIS — Z3A19 19 weeks gestation of pregnancy: Secondary | ICD-10-CM

## 2022-10-08 DIAGNOSIS — O24419 Gestational diabetes mellitus in pregnancy, unspecified control: Secondary | ICD-10-CM

## 2022-10-08 DIAGNOSIS — O99212 Obesity complicating pregnancy, second trimester: Secondary | ICD-10-CM

## 2022-10-08 DIAGNOSIS — O10919 Unspecified pre-existing hypertension complicating pregnancy, unspecified trimester: Secondary | ICD-10-CM

## 2022-10-08 DIAGNOSIS — R87612 Low grade squamous intraepithelial lesion on cytologic smear of cervix (LGSIL): Secondary | ICD-10-CM

## 2022-10-08 DIAGNOSIS — Z6841 Body Mass Index (BMI) 40.0 and over, adult: Secondary | ICD-10-CM

## 2022-10-08 DIAGNOSIS — O99210 Obesity complicating pregnancy, unspecified trimester: Secondary | ICD-10-CM

## 2022-10-08 NOTE — Progress Notes (Addendum)
   PRENATAL VISIT NOTE  Subjective:  Kristine Johnson is a 37 y.o. G2P0010 at [redacted]w[redacted]d being seen today for ongoing prenatal care.  She is currently monitored for the following issues for this high-risk pregnancy and has Low grade squamous intraepithelial lesion (LGSIL) on cervical Pap smear; Uterine leiomyoma; Supervision of other normal pregnancy, antepartum; Alpha thalassemia silent carrier; GDM at 13 weeks; BMI 40.0-44.9, adult; Chronic hypertension in pregnancy; and Obesity in pregnancy on their problem list.  Patient reports no complaints.  Contractions: Not present. Vag. Bleeding: None.  Movement: Present. Denies leaking of fluid.   The following portions of the patient's history were reviewed and updated as appropriate: allergies, current medications, past family history, past medical history, past social history, past surgical history and problem list.   Objective:   Vitals:   10/08/22 0834  BP: 125/73  Pulse: 96  Weight: 231 lb 1.6 oz (104.8 kg)    Fetal Status: Fetal Heart Rate (bpm): 164   Movement: Present     General:  Alert, oriented and cooperative. Patient is in no acute distress.  Skin: Skin is warm and dry. No rash noted.   Cardiovascular: Normal heart rate noted  Respiratory: Normal respiratory effort, no problems with respiration noted  Abdomen: Soft, gravid, appropriate for gestational age.  Pain/Pressure: Present     Pelvic: Cervical exam deferred        Extremities: Normal range of motion.  Edema: Trace  Mental Status: Normal mood and affect. Normal behavior. Normal judgment and thought content.   Assessment and Plan:  Pregnancy: G2P0010 at [redacted]w[redacted]d 1. [redacted] weeks gestation of pregnancy Afp and mfm anatomy u/s wnl  2. Gestational diabetes mellitus (GDM), antepartum, gestational diabetes method of control unspecified AM fastings borderline in the 100s about 1/3 to 1/4. I told her I'd recommend starting qhs metfomrin given these are true fastings. 2 hour  post prandials wnl. Pt desires to work on nutritional changes and f/u in 2wks.   3. BMI 40.0-44.9, adult Weight stable  4. Obesity in pregnancy  5. Chronic hypertension in pregnancy Confirms on low dose asa. On procardia 30 qday  6. Low grade squamous intraepithelial lesion (LGSIL) on cervical Pap smear Repeat pap pp  7. Uterine leiomyoma, unspecified location 3-4cm on anatomy u/s  Preterm labor symptoms and general obstetric precautions including but not limited to vaginal bleeding, contractions, leaking of fluid and fetal movement were reviewed in detail with the patient. Please refer to After Visit Summary for other counseling recommendations.   No follow-ups on file.  Future Appointments  Date Time Provider Department Center  11/06/2022 10:45 AM [facility] NURSE [facility]  11/06/2022 11:00 AM [facility] US1 [facility]  11/17/2022  2:15 PM [facility]  11/26/2022 10:45 AM [facility]-BEHAVIORAL HEALTH CLINICIAN [facility]    [place] Bing, MD

## 2022-11-04 ENCOUNTER — Other Ambulatory Visit: Payer: Self-pay

## 2022-11-04 ENCOUNTER — Ambulatory Visit (INDEPENDENT_AMBULATORY_CARE_PROVIDER_SITE_OTHER): Payer: BC Managed Care – PPO | Admitting: Certified Nurse Midwife

## 2022-11-04 VITALS — BP 112/71 | HR 101 | Wt 234.0 lb

## 2022-11-04 DIAGNOSIS — O0992 Supervision of high risk pregnancy, unspecified, second trimester: Secondary | ICD-10-CM

## 2022-11-04 DIAGNOSIS — O24410 Gestational diabetes mellitus in pregnancy, diet controlled: Secondary | ICD-10-CM

## 2022-11-04 DIAGNOSIS — O10919 Unspecified pre-existing hypertension complicating pregnancy, unspecified trimester: Secondary | ICD-10-CM

## 2022-11-04 DIAGNOSIS — Z3A23 23 weeks gestation of pregnancy: Secondary | ICD-10-CM

## 2022-11-04 NOTE — Progress Notes (Signed)
   PRENATAL VISIT NOTE  Subjective:  Kristine Johnson is a 37 y.o. G2P0010 at [redacted]w[redacted]d being seen today for ongoing prenatal care.  She is currently monitored for the following issues for this high-risk pregnancy and has Low grade squamous intraepithelial lesion (LGSIL) on cervical Pap smear; Uterine leiomyoma; Supervision of other normal pregnancy, antepartum; Alpha thalassemia silent carrier; GDM at 13 weeks; BMI 40.0-44.9, adult ([facility]); Chronic hypertension in pregnancy; and Obesity in pregnancy on their problem list.  Patient reports no complaints.  Contractions: Not present. Vag. Bleeding: None.  Movement: Present. Denies leaking of fluid.   The following portions of the patient's history were reviewed and updated as appropriate: allergies, current medications, past family history, past medical history, past social history, past surgical history and problem list.   Objective:   Vitals:   11/04/22 1416  BP: 112/71  Pulse: (!) 101  Weight: 234 lb (106.1 kg)    Fetal Status: Fetal Heart Rate (bpm): 160 Fundal Height: 23 cm Movement: Present     General:  Alert, oriented and cooperative. Patient is in no acute distress.  Skin: Skin is warm and dry. No rash noted.   Cardiovascular: Normal heart rate noted  Respiratory: Normal respiratory effort, no problems with respiration noted  Abdomen: Soft, gravid, appropriate for gestational age.  Pain/Pressure: Absent     Pelvic: Cervical exam deferred        Extremities: Normal range of motion.  Edema: None  Mental Status: Normal mood and affect. Normal behavior. Normal judgment and thought content.   Assessment and Plan:  Pregnancy: G2P0010 at [redacted]w[redacted]d 1. Supervision of high risk pregnancy in second trimester - Doing well, feeling regular and vigorous fetal movement   2. [redacted] weeks gestation of pregnancy - Routine OB care   3. Diet controlled gestational diabetes mellitus (GDM) in second trimester - All post-prandials well within  range, many of her fastings are still elevated but only slightly (all <105). Has adjusted her diet well but is still going long periods during the day without eating, often wakes up hungry at night and has fruit or a granola bar. - Lengthy discussion re importance of eating regularly throughout the day with a protein+complex carb snack at bedtime (not middle of the night) to prevent pre-dawn glucagon release. Pt desires to try these adjustments but is amenable to starting metformin if her fastings continue to be elevated at next visit.  4. Chronic hypertension in pregnancy - Stable on 30mg  procardia XR daily  Preterm labor symptoms and general obstetric precautions including but not limited to vaginal bleeding, contractions, leaking of fluid and fetal movement were reviewed in detail with the patient. Please refer to After Visit Summary for other counseling recommendations.   Return in about 3 weeks (around 11/25/2022) for IN-PERSON, HOB.  Future Appointments  Date Time Provider Department Center  11/06/2022 10:45 AM [facility] NURSE [facility]  11/06/2022 11:00 AM [facility] US1 [facility]  11/17/2022  2:15 PM [facility]  11/26/2022 10:45 AM [facility]-BEHAVIORAL HEALTH CLINICIAN [facility]  11/26/2022  2:35 PM Adam Phenix, MD [facility]    Bernerd Limbo, CNM

## 2022-11-06 ENCOUNTER — Encounter: Payer: Self-pay | Admitting: *Deleted

## 2022-11-06 ENCOUNTER — Ambulatory Visit: Payer: BC Managed Care – PPO | Admitting: *Deleted

## 2022-11-06 ENCOUNTER — Ambulatory Visit: Payer: BC Managed Care – PPO | Attending: Obstetrics and Gynecology

## 2022-11-06 VITALS — BP 121/64 | HR 86

## 2022-11-06 DIAGNOSIS — O10012 Pre-existing essential hypertension complicating pregnancy, second trimester: Secondary | ICD-10-CM

## 2022-11-06 DIAGNOSIS — Z3A24 24 weeks gestation of pregnancy: Secondary | ICD-10-CM | POA: Diagnosis not present

## 2022-11-06 DIAGNOSIS — O99212 Obesity complicating pregnancy, second trimester: Secondary | ICD-10-CM | POA: Insufficient documentation

## 2022-11-06 DIAGNOSIS — O24410 Gestational diabetes mellitus in pregnancy, diet controlled: Secondary | ICD-10-CM | POA: Insufficient documentation

## 2022-11-06 DIAGNOSIS — O3412 Maternal care for benign tumor of corpus uteri, second trimester: Secondary | ICD-10-CM

## 2022-11-06 DIAGNOSIS — Z363 Encounter for antenatal screening for malformations: Secondary | ICD-10-CM | POA: Insufficient documentation

## 2022-11-06 DIAGNOSIS — E669 Obesity, unspecified: Secondary | ICD-10-CM

## 2022-11-06 DIAGNOSIS — Z362 Encounter for other antenatal screening follow-up: Secondary | ICD-10-CM | POA: Diagnosis not present

## 2022-11-06 DIAGNOSIS — O24419 Gestational diabetes mellitus in pregnancy, unspecified control: Secondary | ICD-10-CM

## 2022-11-06 DIAGNOSIS — D259 Leiomyoma of uterus, unspecified: Secondary | ICD-10-CM | POA: Diagnosis not present

## 2022-11-06 DIAGNOSIS — Z148 Genetic carrier of other disease: Secondary | ICD-10-CM | POA: Diagnosis not present

## 2022-11-06 DIAGNOSIS — O10112 Pre-existing hypertensive heart disease complicating pregnancy, second trimester: Secondary | ICD-10-CM | POA: Insufficient documentation

## 2022-11-06 DIAGNOSIS — D563 Thalassemia minor: Secondary | ICD-10-CM

## 2022-11-06 DIAGNOSIS — Z3480 Encounter for supervision of other normal pregnancy, unspecified trimester: Secondary | ICD-10-CM | POA: Insufficient documentation

## 2022-11-06 DIAGNOSIS — O10912 Unspecified pre-existing hypertension complicating pregnancy, second trimester: Secondary | ICD-10-CM

## 2022-11-06 DIAGNOSIS — O285 Abnormal chromosomal and genetic finding on antenatal screening of mother: Secondary | ICD-10-CM

## 2022-11-09 ENCOUNTER — Other Ambulatory Visit: Payer: Self-pay

## 2022-11-09 DIAGNOSIS — O99212 Obesity complicating pregnancy, second trimester: Secondary | ICD-10-CM

## 2022-11-09 DIAGNOSIS — O10912 Unspecified pre-existing hypertension complicating pregnancy, second trimester: Secondary | ICD-10-CM

## 2022-11-12 NOTE — BH Specialist Note (Deleted)
Integrated Behavioral Health via Telemedicine Visit  11/12/2022 Kristine Johnson [id number]  Number of Integrated Behavioral Health Clinician visits: 2- Second Visit  Session Start time: 1316   Session End time: 1343  Total time in minutes: 27   Referring Provider: *** Patient/Family location: [facility] Provider location: *** All persons participating in visit: *** Types of Service: {CHL AMB TYPE OF SERVICE:[phone]}  I connected with Kristine Johnson and/or Kristine Johnson's {family members:20773} via  Telephone or Engineer, civil (consulting)  (Video is Surveyor, mining) and verified that I am speaking with the correct person using two identifiers. Discussed confidentiality: {YES/NO:21197}  I discussed the limitations of telemedicine and the availability of in person appointments.  Discussed there is a possibility of technology failure and discussed alternative modes of communication if that failure occurs.  I discussed that engaging in this telemedicine visit, they consent to the provision of behavioral healthcare and the services will be billed under their insurance.  Patient and/or legal guardian expressed understanding and consented to Telemedicine visit: {YES/NO:21197}  Presenting Concerns: Patient and/or family reports the following symptoms/concerns: *** Duration of problem: ***; Severity of problem: {Mild/Moderate/Severe:20260}  Patient and/or Family's Strengths/Protective Factors: {CHL AMB BH PROTECTIVE FACTORS:[phone]}  Goals Addressed: Patient will:  Reduce symptoms of: {IBH Symptoms:21014056}   Increase knowledge and/or ability of: {IBH Patient Tools:21014057}   Demonstrate ability to: {IBH Goals:21014053}  Progress towards Goals: {CHL AMB BH PROGRESS TOWARDS GOALS:[phone]}  Interventions: Interventions utilized:  {IBH Interventions:21014054} Standardized Assessments completed: {IBH Screening  Tools:21014051}  Patient and/or Family Response: ***  Assessment: Patient currently experiencing ***.   Patient may benefit from ***.  Plan: Follow up with behavioral health clinician on : *** Behavioral recommendations: *** Referral(s): {IBH Referrals:21014055}  I discussed the assessment and treatment plan with the patient and/or parent/guardian. They were provided an opportunity to ask questions and all were answered. They agreed with the plan and demonstrated an understanding of the instructions.   They were advised to call back or seek an in-person evaluation if the symptoms worsen or if the condition fails to improve as anticipated.  Kristine Johnson Kristine Mineer, LCSW

## 2022-11-17 ENCOUNTER — Encounter: Payer: Self-pay | Admitting: Registered"

## 2022-11-17 ENCOUNTER — Ambulatory Visit (INDEPENDENT_AMBULATORY_CARE_PROVIDER_SITE_OTHER): Payer: BC Managed Care – PPO | Admitting: Registered"

## 2022-11-17 ENCOUNTER — Other Ambulatory Visit: Payer: Self-pay

## 2022-11-17 ENCOUNTER — Encounter: Payer: BC Managed Care – PPO | Attending: Family Medicine | Admitting: Registered"

## 2022-11-17 DIAGNOSIS — Z3A25 25 weeks gestation of pregnancy: Secondary | ICD-10-CM

## 2022-11-17 DIAGNOSIS — O24419 Gestational diabetes mellitus in pregnancy, unspecified control: Secondary | ICD-10-CM | POA: Insufficient documentation

## 2022-11-17 NOTE — Progress Notes (Signed)
Patient was seen for follow-up visit for Gestational Diabetes self-management   Start time 1415 and End time 1430   Estimated due date: 02/26/23; [redacted]w[redacted]d  Clinical: Medications: iron, albuterol, prenatal  Medical History: GDM, prediabetes Labs:  Component Ref Range & Units 2 wk ago  Glucose, Fasting 70 - 91 mg/dL 93 High   Glucose, 1 hour 70 - 179 mg/dL 213  Glucose, 2 hour 70 - 152 mg/dL 086   Lab Results  Component Value Date   HGBA1C 5.8 (H) 08/13/2022    Dietary and Lifestyle History: Pt states her sister has started meal prepping for her so she is able to eat better and her blood sugar has improved. Pt states her FBS sometimes goes over due to late dinners. Pt states she is usually not that hungry and doesn't eat large meals. Discussed meal options when she gets home late.   Pt states since she has been eating home-cooked meals she can really taste the extra salt in processed foods.  Physical Activity: not assessed this visit Stress: not assessed Sleep: 6-7 hrs   24 hr Recall:  First Meal: Chaboni vanilla [place] yogurt, kind granola, fresh fruit Snack: 2 fruit bar Second meal: salad, grilled chicken Snack: baked chips Third meal: salad, [place] burgers Snack: fruit pops Beverages: water, sugar soda  NUTRITION INTERVENTION  Nutrition education (E-1) on the following topics:   Initial First Second  Follow-up     follow-up   [x]  [x]  Definition of Gestational Diabetes (also discussed A1c) reviewed changes to expect in 3rd trimester. [x]  []  Why dietary management is important in controlling blood glucose [x]  []  Effects each nutrient has on blood glucose levels [x]  []  Simple carbohydrates vs complex carbohydrates []  [x]  Fluid intake [x]  []  [x]  Creating a balanced meal plan [x]  []  Carbohydrate counting (basics) [x]  []  When to check blood glucose levels []  [x]  Proper blood glucose monitoring techniques []  [x]  Effect of stress and stress reduction techniques   []  [x]  Exercise effect on blood glucose levels, appropriate exercise during pregnancy []  [x]  Importance of limiting caffeine and abstaining from alcohol and smoking []  [x]  Medications used for blood sugar control during pregnancy []  [x]  Hypoglycemia and rule of 15 []  [x]  Postpartum self care  Patient instructed to monitor glucose levels: FBS: 60 - ? 95 mg/dL; 2 hour: ? 578 mg/dL  Patient received handouts: none  Patient will be seen for follow-up as needed

## 2022-11-26 ENCOUNTER — Other Ambulatory Visit: Payer: Self-pay

## 2022-11-26 ENCOUNTER — Ambulatory Visit (INDEPENDENT_AMBULATORY_CARE_PROVIDER_SITE_OTHER): Payer: BC Managed Care – PPO | Admitting: Obstetrics & Gynecology

## 2022-11-26 VITALS — BP 126/75 | HR 84 | Wt 235.7 lb

## 2022-11-26 DIAGNOSIS — O24419 Gestational diabetes mellitus in pregnancy, unspecified control: Secondary | ICD-10-CM

## 2022-11-26 DIAGNOSIS — O99212 Obesity complicating pregnancy, second trimester: Secondary | ICD-10-CM

## 2022-11-26 DIAGNOSIS — D259 Leiomyoma of uterus, unspecified: Secondary | ICD-10-CM

## 2022-11-26 DIAGNOSIS — Z3A26 26 weeks gestation of pregnancy: Secondary | ICD-10-CM

## 2022-11-26 DIAGNOSIS — O99210 Obesity complicating pregnancy, unspecified trimester: Secondary | ICD-10-CM

## 2022-11-26 DIAGNOSIS — Z23 Encounter for immunization: Secondary | ICD-10-CM | POA: Diagnosis not present

## 2022-11-26 DIAGNOSIS — O10912 Unspecified pre-existing hypertension complicating pregnancy, second trimester: Secondary | ICD-10-CM

## 2022-11-26 DIAGNOSIS — Z6841 Body Mass Index (BMI) 40.0 and over, adult: Secondary | ICD-10-CM

## 2022-11-26 DIAGNOSIS — O10919 Unspecified pre-existing hypertension complicating pregnancy, unspecified trimester: Secondary | ICD-10-CM

## 2022-11-26 DIAGNOSIS — Z3480 Encounter for supervision of other normal pregnancy, unspecified trimester: Secondary | ICD-10-CM

## 2022-11-26 MED ORDER — FERROUS GLUCONATE 324 (37.5 FE) MG PO TABS: 1.0000 | ORAL_TABLET | Freq: Every day | ORAL | 2 refills | Status: DC

## 2022-11-26 NOTE — Progress Notes (Signed)
   PRENATAL VISIT NOTE  Subjective:  Kristine Johnson is a 37 y.o. G2P0010 at [redacted]w[redacted]d being seen today for ongoing prenatal care.  She is currently monitored for the following issues for this high-risk pregnancy and has Low grade squamous intraepithelial lesion (LGSIL) on cervical Pap smear; Uterine leiomyoma; Supervision of other normal pregnancy, antepartum; Alpha thalassemia silent carrier; GDM at 13 weeks; BMI 40.0-44.9, adult (HCC); Chronic hypertension in pregnancy; and Obesity in pregnancy on their problem list.  Patient reports no complaints.  Contractions: Not present. Vag. Bleeding: None.  Movement: Present. Denies leaking of fluid.   The following portions of the patient's history were reviewed and updated as appropriate: allergies, current medications, past family history, past medical history, past social history, past surgical history and problem list.   Objective:   Vitals:   11/26/22 1534  BP: 126/75  Pulse: 84  Weight: 235 lb 11.2 oz (106.9 kg)    Fetal Status: Fetal Heart Rate (bpm): 154   Movement: Present     General:  Alert, oriented and cooperative. Patient is in no acute distress.  Skin: Skin is warm and dry. No rash noted.   Cardiovascular: Normal heart rate noted  Respiratory: Normal respiratory effort, no problems with respiration noted  Abdomen: Soft, gravid, appropriate for gestational age.  Pain/Pressure: Absent     Pelvic: Cervical exam deferred        Extremities: Normal range of motion.  Edema: Trace  Mental Status: Normal mood and affect. Normal behavior. Normal judgment and thought content.   Assessment and Plan:  Pregnancy: G2P0010 at [redacted]w[redacted]d 1. Supervision of other normal pregnancy, antepartum -Doing well, feeling regular fetal movement.  -FH appropriate  - Tdap vaccine greater than or equal to 7yo IM - Ferrous Gluconate 324 (37.5 Fe) MG TABS; Take 1 tablet (324 mg total) by mouth daily.  Dispense: 30 tablet; Refill: 2  2. Obesity in  pregnancy  3. Chronic hypertension in pregnancy -Well controlled. Continue Nifedipine and low dose aspirin.   4. BMI 40.0-44.9, adult (HCC) Body mass index is 43.81 kg/m.   5. Gestational diabetes mellitus (GDM), antepartum, gestational diabetes method of control unspecified - Well controlled with diet.. Glucose log appropriate.   6. Uterine leiomyoma, unspecified location -FH appropriate for gestational age.   Preterm labor symptoms and general obstetric precautions including but not limited to vaginal bleeding, contractions, leaking of fluid and fetal movement were reviewed in detail with the patient. Please refer to After Visit Summary for other counseling recommendations.   Return in about 2 weeks (around 12/10/2022).  Future Appointments  Date Time Provider Department Center  12/04/2022  7:15 AM [facility] NURSE [facility]  12/04/2022  7:30 AM [facility] US3 [facility]  01/01/2023  8:30 AM [facility] NURSE [facility]  01/01/2023  8:45 AM [facility] US4 [facility]    Camille Bal, Medical Student   Attestation of Attending Supervision of Medical Student: Evaluation and management procedures were performed by the medical student under my supervision and collaboration.  I have reviewed the student's note and chart, and I agree with the management and plan.  Scheryl Darter, MD, FACOG Attending Obstetrician & Gynecologist Faculty Practice, [facility]

## 2022-12-04 ENCOUNTER — Ambulatory Visit: Payer: BC Managed Care – PPO | Admitting: *Deleted

## 2022-12-04 ENCOUNTER — Ambulatory Visit: Payer: BC Managed Care – PPO

## 2022-12-04 ENCOUNTER — Ambulatory Visit: Payer: BC Managed Care – PPO | Attending: Maternal & Fetal Medicine

## 2022-12-04 VITALS — BP 138/69 | HR 96

## 2022-12-04 DIAGNOSIS — O24410 Gestational diabetes mellitus in pregnancy, diet controlled: Secondary | ICD-10-CM

## 2022-12-04 DIAGNOSIS — O3413 Maternal care for benign tumor of corpus uteri, third trimester: Secondary | ICD-10-CM

## 2022-12-04 DIAGNOSIS — D252 Subserosal leiomyoma of uterus: Secondary | ICD-10-CM | POA: Diagnosis present

## 2022-12-04 DIAGNOSIS — E669 Obesity, unspecified: Secondary | ICD-10-CM

## 2022-12-04 DIAGNOSIS — O10912 Unspecified pre-existing hypertension complicating pregnancy, second trimester: Secondary | ICD-10-CM | POA: Diagnosis present

## 2022-12-04 DIAGNOSIS — D250 Submucous leiomyoma of uterus: Secondary | ICD-10-CM

## 2022-12-04 DIAGNOSIS — D563 Thalassemia minor: Secondary | ICD-10-CM

## 2022-12-04 DIAGNOSIS — O10913 Unspecified pre-existing hypertension complicating pregnancy, third trimester: Secondary | ICD-10-CM | POA: Insufficient documentation

## 2022-12-04 DIAGNOSIS — O99213 Obesity complicating pregnancy, third trimester: Secondary | ICD-10-CM | POA: Diagnosis not present

## 2022-12-04 DIAGNOSIS — O99212 Obesity complicating pregnancy, second trimester: Secondary | ICD-10-CM | POA: Insufficient documentation

## 2022-12-04 DIAGNOSIS — O10013 Pre-existing essential hypertension complicating pregnancy, third trimester: Secondary | ICD-10-CM

## 2022-12-04 DIAGNOSIS — Z3A28 28 weeks gestation of pregnancy: Secondary | ICD-10-CM

## 2022-12-04 DIAGNOSIS — D259 Leiomyoma of uterus, unspecified: Secondary | ICD-10-CM

## 2022-12-04 DIAGNOSIS — O285 Abnormal chromosomal and genetic finding on antenatal screening of mother: Secondary | ICD-10-CM

## 2022-12-10 ENCOUNTER — Encounter: Payer: Self-pay | Admitting: Family Medicine

## 2022-12-14 ENCOUNTER — Ambulatory Visit (INDEPENDENT_AMBULATORY_CARE_PROVIDER_SITE_OTHER): Payer: BC Managed Care – PPO | Admitting: Obstetrics and Gynecology

## 2022-12-14 ENCOUNTER — Encounter: Payer: Self-pay | Admitting: General Practice

## 2022-12-14 ENCOUNTER — Other Ambulatory Visit: Payer: Self-pay

## 2022-12-14 VITALS — BP 122/81 | HR 76 | Wt 238.0 lb

## 2022-12-14 DIAGNOSIS — O10913 Unspecified pre-existing hypertension complicating pregnancy, third trimester: Secondary | ICD-10-CM

## 2022-12-14 DIAGNOSIS — O24410 Gestational diabetes mellitus in pregnancy, diet controlled: Secondary | ICD-10-CM

## 2022-12-14 DIAGNOSIS — O99210 Obesity complicating pregnancy, unspecified trimester: Secondary | ICD-10-CM

## 2022-12-14 DIAGNOSIS — O10919 Unspecified pre-existing hypertension complicating pregnancy, unspecified trimester: Secondary | ICD-10-CM

## 2022-12-14 DIAGNOSIS — D259 Leiomyoma of uterus, unspecified: Secondary | ICD-10-CM

## 2022-12-14 DIAGNOSIS — Z6841 Body Mass Index (BMI) 40.0 and over, adult: Secondary | ICD-10-CM

## 2022-12-14 DIAGNOSIS — Z3A29 29 weeks gestation of pregnancy: Secondary | ICD-10-CM

## 2022-12-14 DIAGNOSIS — O99213 Obesity complicating pregnancy, third trimester: Secondary | ICD-10-CM

## 2022-12-14 DIAGNOSIS — R87612 Low grade squamous intraepithelial lesion on cytologic smear of cervix (LGSIL): Secondary | ICD-10-CM

## 2022-12-14 NOTE — Progress Notes (Signed)
    PRENATAL VISIT NOTE  Subjective:  Kristine Johnson is a 37 y.o. G2P0010 at [redacted]w[redacted]d being seen today for ongoing prenatal care.  She is currently monitored for the following issues for this high-risk pregnancy and has Low grade squamous intraepithelial lesion (LGSIL) on cervical Pap smear; Uterine leiomyoma; Supervision of other normal pregnancy, antepartum; Alpha thalassemia silent carrier; GDM at 13 weeks; BMI 40.0-44.9, adult (HCC); Chronic hypertension in pregnancy; and Obesity in pregnancy on their problem list.  Patient reports  bilateral lower extremity edema .  Contractions: Not present. Vag. Bleeding: None.  Movement: Present. Denies leaking of fluid.   The following portions of the patient's history were reviewed and updated as appropriate: allergies, current medications, past family history, past medical history, past social history, past surgical history and problem list.   Objective:   Vitals:   12/14/22 1104  BP: 122/81  Pulse: 76  Weight: 238 lb (108 kg)    Fetal Status: Fetal Heart Rate (bpm): 130   Movement: Present     General:  Alert, oriented and cooperative. Patient is in no acute distress.  Skin: Skin is warm and dry. No rash noted.   Cardiovascular: Normal heart rate noted  Respiratory: Normal respiratory effort, no problems with respiration noted  Abdomen: Soft, gravid, appropriate for gestational age.  Pain/Pressure: Present     Pelvic: Cervical exam deferred        Extremities: Normal range of motion.  Edema: Trace  Mental Status: Normal mood and affect. Normal behavior. Normal judgment and thought content.   Assessment and Plan:  Pregnancy: G2P0010 at [redacted]w[redacted]d 1. [redacted] weeks gestation of pregnancy TED/compression stockings.  - CBC - HIV antibody (with reflex) - RPR  2. Chronic hypertension in pregnancy Doing well on procardia 30 qday and low dose asa Start testing at 32-34wks 6/14: 27%, 1115gm, ac 28%, afi 20.4  3. BMI 40.0-44.9, adult  (HCC)   4. Obesity in pregnancy Weight stable  5. Diet controlled gestational diabetes mellitus (GDM), antepartum Normal qid CBG log  6. Uterine leiomyoma, unspecified location Not mentioned at last u/s  Preterm labor symptoms and general obstetric precautions including but not limited to vaginal bleeding, contractions, leaking of fluid and fetal movement were reviewed in detail with the patient. Please refer to After Visit Summary for other counseling recommendations.   Return in about 2 weeks (around 12/28/2022) for in person, md or app, high risk ob.  Future Appointments  Date Time Provider Department Center  01/01/2023  8:30 AM [facility] NURSE [facility]  01/01/2023  8:45 AM [facility] US4 [facility]  01/01/2023 10:55 AM Reva Bores, MD [facility]  01/08/2023 10:30 AM [facility] NURSE [facility]  01/08/2023 10:45 AM [facility] NST [facility]  01/13/2023  1:15 PM Myrtie Hawk, DO [facility]  01/15/2023 10:30 AM [facility] NURSE [facility]  01/15/2023 10:45 AM [facility] NST [facility]  01/22/2023 10:30 AM [facility] NURSE [facility]  01/22/2023 10:45 AM [facility] NST [facility]    Ventura Bing, MD

## 2022-12-15 LAB — CBC
Hematocrit: 34.8 % (ref 34.0–46.6)
Hemoglobin: 10.9 g/dL — ABNORMAL LOW (ref 11.1–15.9)
MCH: 26.3 pg — ABNORMAL LOW (ref 26.6–33.0)
MCHC: 31.3 g/dL — ABNORMAL LOW (ref 31.5–35.7)
MCV: 84 fL (ref 79–97)
Platelets: 251 10*3/uL (ref 150–450)
RBC: 4.14 x10E6/uL (ref 3.77–5.28)
RDW: 13.5 % (ref 11.7–15.4)
WBC: 6.2 10*3/uL (ref 3.4–10.8)

## 2022-12-15 LAB — RPR: RPR Ser Ql: NONREACTIVE

## 2022-12-15 LAB — HIV ANTIBODY (ROUTINE TESTING W REFLEX): HIV Screen 4th Generation wRfx: NONREACTIVE

## 2022-12-16 ENCOUNTER — Encounter: Payer: Self-pay | Admitting: Obstetrics and Gynecology

## 2023-01-01 ENCOUNTER — Ambulatory Visit: Payer: BC Managed Care – PPO | Admitting: *Deleted

## 2023-01-01 ENCOUNTER — Other Ambulatory Visit: Payer: Self-pay | Admitting: *Deleted

## 2023-01-01 ENCOUNTER — Ambulatory Visit (INDEPENDENT_AMBULATORY_CARE_PROVIDER_SITE_OTHER): Payer: BC Managed Care – PPO | Admitting: Family Medicine

## 2023-01-01 ENCOUNTER — Ambulatory Visit (HOSPITAL_BASED_OUTPATIENT_CLINIC_OR_DEPARTMENT_OTHER): Payer: BC Managed Care – PPO

## 2023-01-01 ENCOUNTER — Other Ambulatory Visit: Payer: Self-pay

## 2023-01-01 ENCOUNTER — Other Ambulatory Visit (HOSPITAL_COMMUNITY)
Admission: RE | Admit: 2023-01-01 | Discharge: 2023-01-01 | Disposition: A | Discharge status: Home / Self Care | Payer: BC Managed Care – PPO | Source: Ambulatory Visit | Attending: Family Medicine | Admitting: Family Medicine

## 2023-01-01 VITALS — BP 131/75 | HR 101

## 2023-01-01 VITALS — BP 106/70 | HR 76 | Wt 238.5 lb

## 2023-01-01 DIAGNOSIS — D259 Leiomyoma of uterus, unspecified: Secondary | ICD-10-CM

## 2023-01-01 DIAGNOSIS — Z3A32 32 weeks gestation of pregnancy: Secondary | ICD-10-CM

## 2023-01-01 DIAGNOSIS — O99212 Obesity complicating pregnancy, second trimester: Secondary | ICD-10-CM | POA: Insufficient documentation

## 2023-01-01 DIAGNOSIS — O99213 Obesity complicating pregnancy, third trimester: Secondary | ICD-10-CM | POA: Diagnosis not present

## 2023-01-01 DIAGNOSIS — O10912 Unspecified pre-existing hypertension complicating pregnancy, second trimester: Secondary | ICD-10-CM | POA: Insufficient documentation

## 2023-01-01 DIAGNOSIS — O24410 Gestational diabetes mellitus in pregnancy, diet controlled: Secondary | ICD-10-CM

## 2023-01-01 DIAGNOSIS — O10919 Unspecified pre-existing hypertension complicating pregnancy, unspecified trimester: Secondary | ICD-10-CM

## 2023-01-01 DIAGNOSIS — O10913 Unspecified pre-existing hypertension complicating pregnancy, third trimester: Secondary | ICD-10-CM

## 2023-01-01 DIAGNOSIS — O24419 Gestational diabetes mellitus in pregnancy, unspecified control: Secondary | ICD-10-CM

## 2023-01-01 DIAGNOSIS — D563 Thalassemia minor: Secondary | ICD-10-CM

## 2023-01-01 DIAGNOSIS — E669 Obesity, unspecified: Secondary | ICD-10-CM | POA: Diagnosis not present

## 2023-01-01 DIAGNOSIS — O10013 Pre-existing essential hypertension complicating pregnancy, third trimester: Secondary | ICD-10-CM

## 2023-01-01 DIAGNOSIS — O99210 Obesity complicating pregnancy, unspecified trimester: Secondary | ICD-10-CM

## 2023-01-01 DIAGNOSIS — N898 Other specified noninflammatory disorders of vagina: Secondary | ICD-10-CM | POA: Diagnosis not present

## 2023-01-01 DIAGNOSIS — R87612 Low grade squamous intraepithelial lesion on cytologic smear of cervix (LGSIL): Secondary | ICD-10-CM | POA: Diagnosis not present

## 2023-01-01 DIAGNOSIS — O3413 Maternal care for benign tumor of corpus uteri, third trimester: Secondary | ICD-10-CM

## 2023-01-01 DIAGNOSIS — Z113 Encounter for screening for infections with a predominantly sexual mode of transmission: Secondary | ICD-10-CM | POA: Diagnosis not present

## 2023-01-01 DIAGNOSIS — Z3480 Encounter for supervision of other normal pregnancy, unspecified trimester: Secondary | ICD-10-CM

## 2023-01-01 MED ORDER — OMEPRAZOLE MAGNESIUM 20 MG PO TBEC
20.0000 mg | DELAYED_RELEASE_TABLET | Freq: Every day | ORAL | 3 refills | Status: DC
Start: 2023-01-01 — End: 2023-02-08

## 2023-01-01 NOTE — Progress Notes (Signed)
   PRENATAL VISIT NOTE  Subjective:  Kristine Johnson is a 37 y.o. G2P0010 at [redacted]w[redacted]d being seen today for ongoing prenatal care.  She is currently monitored for the following issues for this high-risk pregnancy and has Low grade squamous intraepithelial lesion (LGSIL) on cervical Pap smear; Uterine leiomyoma; Supervision of other normal pregnancy, antepartum; Alpha thalassemia silent carrier; GDM at 13 weeks; BMI 40.0-44.9, adult (HCC); Chronic hypertension in pregnancy; and Obesity in pregnancy on their problem list.  Patient reports  leaking fluid .  Contractions: Irritability. Vag. Bleeding: None.  Movement: Present. Denies leaking of fluid.   The following portions of the patient's history were reviewed and updated as appropriate: allergies, current medications, past family history, past medical history, past social history, past surgical history and problem list.   Objective:   Vitals:   01/01/23 1141  BP: 106/70  Pulse: 76  Weight: 238 lb 8 oz (108.2 kg)    Fetal Status: Fetal Heart Rate (bpm): 145 Fundal Height: 32 cm Movement: Present     General:  Alert, oriented and cooperative. Patient is in no acute distress.  Skin: Skin is warm and dry. No rash noted.   Cardiovascular: Normal heart rate noted  Respiratory: Normal respiratory effort, no problems with respiration noted  Abdomen: Soft, gravid, appropriate for gestational age.  Pain/Pressure: Absent     Pelvic: Cervical exam performed in the presence of a chaperone Negative pooling       Extremities: Normal range of motion.  Edema: None  Mental Status: Normal mood and affect. Normal behavior. Normal judgment and thought content.   Assessment and Plan:  Pregnancy: G2P0010 at [redacted]w[redacted]d 1. Chronic hypertension in pregnancy BP is well controlled on Nifedipine On ASA  2. Diet controlled gestational diabetes mellitus (GDM), antepartum See log    Continue diet Last u/s for growth @ 69%  3. Supervision of other  normal pregnancy, antepartum No evidence of ROM on exam  4. Obesity in pregnancy   5. Vaginal discharge Check wet prep and treat accordingly - Cervicovaginal ancillary only( [place])  Preterm labor symptoms and general obstetric precautions including but not limited to vaginal bleeding, contractions, leaking of fluid and fetal movement were reviewed in detail with the patient. Please refer to After Visit Summary for other counseling recommendations.   Return in about 1 week (around 01/08/2023) for [facility], OB visit and BPP, weekly.  Future Appointments  Date Time Provider Department Center  01/06/2023  9:15 AM [facility] US2 [facility]  01/08/2023 10:30 AM [facility] NURSE [facility]  01/08/2023 10:45 AM [facility] NST [facility]  01/13/2023  1:15 PM Myrtie Hawk, DO [facility]  01/15/2023 10:30 AM [facility] NURSE [facility]  01/15/2023 10:45 AM [facility] NST [facility]  01/22/2023 10:30 AM [facility] NURSE [facility]  01/22/2023 10:45 AM [facility] NST [facility]  01/27/2023  4:15 PM Warden Fillers, MD [facility]  01/29/2023  9:45 AM [facility] NURSE [facility]  01/29/2023 10:00 AM [facility] US1 [facility]  02/04/2023  8:55 AM Warden Fillers, MD [facility]    Reva Bores, MD

## 2023-01-04 LAB — CERVICOVAGINAL ANCILLARY ONLY
Bacterial Vaginitis (gardnerella): NEGATIVE
Candida Glabrata: NEGATIVE
Candida Vaginitis: NEGATIVE
Comment: NEGATIVE
Comment: NEGATIVE
Comment: NEGATIVE

## 2023-01-06 ENCOUNTER — Other Ambulatory Visit: Payer: Self-pay | Admitting: General Practice

## 2023-01-06 ENCOUNTER — Ambulatory Visit (INDEPENDENT_AMBULATORY_CARE_PROVIDER_SITE_OTHER): Payer: BC Managed Care – PPO

## 2023-01-06 DIAGNOSIS — O10919 Unspecified pre-existing hypertension complicating pregnancy, unspecified trimester: Secondary | ICD-10-CM

## 2023-01-06 DIAGNOSIS — Z3A32 32 weeks gestation of pregnancy: Secondary | ICD-10-CM

## 2023-01-06 DIAGNOSIS — O10913 Unspecified pre-existing hypertension complicating pregnancy, third trimester: Secondary | ICD-10-CM | POA: Diagnosis not present

## 2023-01-08 ENCOUNTER — Ambulatory Visit: Payer: BC Managed Care – PPO | Admitting: *Deleted

## 2023-01-08 ENCOUNTER — Ambulatory Visit: Payer: BC Managed Care – PPO | Attending: Obstetrics and Gynecology | Admitting: *Deleted

## 2023-01-08 VITALS — BP 118/86 | HR 95

## 2023-01-08 DIAGNOSIS — O10013 Pre-existing essential hypertension complicating pregnancy, third trimester: Secondary | ICD-10-CM | POA: Diagnosis not present

## 2023-01-08 DIAGNOSIS — O24415 Gestational diabetes mellitus in pregnancy, controlled by oral hypoglycemic drugs: Secondary | ICD-10-CM

## 2023-01-08 DIAGNOSIS — O99213 Obesity complicating pregnancy, third trimester: Secondary | ICD-10-CM

## 2023-01-08 DIAGNOSIS — O10919 Unspecified pre-existing hypertension complicating pregnancy, unspecified trimester: Secondary | ICD-10-CM

## 2023-01-08 DIAGNOSIS — Z3A33 33 weeks gestation of pregnancy: Secondary | ICD-10-CM | POA: Insufficient documentation

## 2023-01-08 DIAGNOSIS — O24410 Gestational diabetes mellitus in pregnancy, diet controlled: Secondary | ICD-10-CM | POA: Diagnosis not present

## 2023-01-08 DIAGNOSIS — D259 Leiomyoma of uterus, unspecified: Secondary | ICD-10-CM

## 2023-01-08 DIAGNOSIS — E669 Obesity, unspecified: Secondary | ICD-10-CM | POA: Diagnosis not present

## 2023-01-08 DIAGNOSIS — O3413 Maternal care for benign tumor of corpus uteri, third trimester: Secondary | ICD-10-CM

## 2023-01-08 DIAGNOSIS — Z3480 Encounter for supervision of other normal pregnancy, unspecified trimester: Secondary | ICD-10-CM

## 2023-01-08 NOTE — Procedures (Signed)
Kristine Johnson [name] 05/13/1986 [redacted]w[redacted]d  Fetus A Non-Stress Test Interpretation for 01/08/23--NST only  Indication: Gestational Diabetes medication controlled and obese, ut fibroids, CHTN  Fetal Heart Rate A Mode: External Baseline Rate (A): 135 bpm Variability: Moderate Accelerations: 15 x 15 Decelerations: None Multiple birth?: No  Uterine Activity Mode: Toco Contraction Frequency (min): none Resting Tone Palpated: Relaxed  Interpretation (Fetal Testing) Nonstress Test Interpretation: Reactive Comments: Tracing reviewed by DR. Schaible

## 2023-01-13 ENCOUNTER — Ambulatory Visit (INDEPENDENT_AMBULATORY_CARE_PROVIDER_SITE_OTHER): Payer: BC Managed Care – PPO | Admitting: Family Medicine

## 2023-01-13 ENCOUNTER — Other Ambulatory Visit: Payer: Self-pay

## 2023-01-13 VITALS — BP 129/83 | HR 94 | Wt 241.1 lb

## 2023-01-13 DIAGNOSIS — O99210 Obesity complicating pregnancy, unspecified trimester: Secondary | ICD-10-CM

## 2023-01-13 DIAGNOSIS — O99213 Obesity complicating pregnancy, third trimester: Secondary | ICD-10-CM

## 2023-01-13 DIAGNOSIS — Z3A33 33 weeks gestation of pregnancy: Secondary | ICD-10-CM

## 2023-01-13 DIAGNOSIS — Z3480 Encounter for supervision of other normal pregnancy, unspecified trimester: Secondary | ICD-10-CM

## 2023-01-13 DIAGNOSIS — O10919 Unspecified pre-existing hypertension complicating pregnancy, unspecified trimester: Secondary | ICD-10-CM

## 2023-01-13 DIAGNOSIS — O24410 Gestational diabetes mellitus in pregnancy, diet controlled: Secondary | ICD-10-CM

## 2023-01-13 DIAGNOSIS — O10913 Unspecified pre-existing hypertension complicating pregnancy, third trimester: Secondary | ICD-10-CM

## 2023-01-13 MED ORDER — METFORMIN HCL 500 MG PO TABS: 500.0000 mg | ORAL_TABLET | Freq: Two times a day (BID) | ORAL | 2 refills | Status: DC

## 2023-01-13 NOTE — Assessment & Plan Note (Addendum)
CBG logs well controlled post prandial, however over half are uncontrolled at fasting. Started metformin 500 BID

## 2023-01-13 NOTE — Assessment & Plan Note (Signed)
7/17: BPP 8/8 BP well controlled today. Contnnue nifedipine 30mg  XL

## 2023-01-13 NOTE — Progress Notes (Signed)
PRENATAL VISIT NOTE  Subjective:  Kristine Johnson is a 37 y.o. G2P0010 at [redacted]w[redacted]d being seen today for ongoing prenatal care.  She is currently monitored for the following issues for this high-risk pregnancy and has Low grade squamous intraepithelial lesion (LGSIL) on cervical Pap smear; Uterine leiomyoma; Supervision of other normal pregnancy, antepartum; Alpha thalassemia silent carrier; GDM at 13 weeks; BMI 40.0-44.9, adult (HCC); Chronic hypertension in pregnancy; and Obesity in pregnancy on their problem list.  Patient reports no complaints.  Contractions: Not present. Vag. Bleeding: None.  Movement: Present. Denies leaking of fluid.   The following portions of the patient's history were reviewed and updated as appropriate: allergies, current medications, past family history, past medical history, past social history, past surgical history and problem list.   Objective:   Vitals:   01/13/23 1327  BP: 129/83  Pulse: 94  Weight: 241 lb 1.6 oz (109.4 kg)    Fetal Status: Fetal Heart Rate (bpm): 135   Movement: Present     General:  Alert, oriented and cooperative. Patient is in no acute distress.  Skin: Skin is warm and dry. No rash noted.   Cardiovascular: Normal heart rate noted  Respiratory: Normal respiratory effort, no problems with respiration noted  Abdomen: Soft, gravid, appropriate for gestational age.  Pain/Pressure: Present (pressure)     Pelvic: Cervical exam deferred        Extremities: Normal range of motion.  Edema: Trace  Mental Status: Normal mood and affect. Normal behavior. Normal judgment and thought content.   Assessment and Plan:  Pregnancy: G2P0010 at [redacted]w[redacted]d  Kristine Johnson was seen today for routine prenatal visit.  Supervision of other normal pregnancy, antepartum Overview:  Nursing Staff Provider  Office Location [facility] Dating  02/26/2023, by Last Menstrual Period  [facility] Model [x ] Traditional [ ]  Centering [ ]  Mom-Baby Dyad     Language  English Anatomy US  WNL  Flu Vaccine  03/28/2022 Genetic/Carrier Screen  NIPS:  low risk female  AFP:   normal [place]: silent carrier alpha thal  TDaP Vaccine   11/26/22 Hgb A1C or  GTT Early 5.8 Third trimester   COVID Vaccine 2 doses/Pfizer   LAB RESULTS   Rhogam  O/Positive/-- (02/22 1109)  Blood Type O/Positive/-- (02/22 1109)   Baby Feeding Plan Breast Antibody Negative (02/22 1109)  Contraception Undecided Rubella 2.01 (02/22 1109)  Circumcision Yes RPR Non Reactive (02/22 1109)   Pediatrician  LIST GIVEN HBsAg Negative (02/22 1109)   Support Person TAMESHA(Sister) HCVAb Non Reactive (02/22 1109)   Prenatal Classes  HIV Non Reactive (02/22 1109)     BTL Consent na GBS   (For PCN allergy, check sensitivities)   VBAC Consent na Pap Diagnosis  Date Value Ref Range Status  11/13/2021 - Low grade squamous intraepithelial lesion (LSIL) (A)  Final         DME Rx [x ] BP cuff [ ]  Weight Scale Waterbirth  [ ]  Class [ ]  Consent [ ]  CNM visit  PHQ9 & GAD7 [  ] new OB [  ] 28 weeks  [  ] 36 weeks Induction  [ ]  Orders Entered [ ] Foley Y/N      Obesity in pregnancy  [redacted] weeks gestation of pregnancy  Diet controlled gestational diabetes mellitus (GDM), antepartum Overview: Metformin 500 BID  Assessment & Plan: CBG logs well controlled post prandial, however over half are uncontrolled at fasting. Started metformin 500 BID   Chronic hypertension in pregnancy Overview:  CHTN   Group I   BP < 140/90, no meds, no preeclampsia, AGA, nml AFV   Group II  BP > 140/90, on meds, no preeclampsia, AGA, nml AFV  24-28-32-36  24-28-32-36  None  32  38-39.6  37-39  (36-37 or earlier for poor control)    Assessment & Plan: 7/17: BPP 8/8 BP well controlled today. Contnnue nifedipine 30mg  XL    Other orders -     metFORMIN HCl; Take 1 tablet (500 mg total) by mouth 2 (two) times daily with a meal.  Dispense: 60 tablet; Refill: 2   Preterm labor symptoms and  general obstetric precautions including but not limited to vaginal bleeding, contractions, leaking of fluid and fetal movement were reviewed in detail with the patient. Please refer to After Visit Summary for other counseling recommendations.   Return in about 2 weeks (around 01/27/2023) for ROB.  Future Appointments  Date Time Provider Department Center  01/15/2023 10:30 AM [facility] NURSE [facility]  01/15/2023 10:45 AM [facility] NST [facility]  01/22/2023 10:30 AM [facility] NURSE [facility]  01/22/2023 10:45 AM [facility] NST [facility]  01/27/2023  4:15 PM Warden Fillers, MD [facility]  01/29/2023  9:45 AM [facility] NURSE [facility]  01/29/2023 10:00 AM [facility] US1 [facility]  02/04/2023  8:55 AM Warden Fillers, MD [facility]    Myrtie Hawk, DO FMOB Fellow, Faculty practice [facility], Center for [facility] 01/13/23  2:06 PM

## 2023-01-14 ENCOUNTER — Encounter: Payer: Self-pay | Admitting: *Deleted

## 2023-01-15 ENCOUNTER — Ambulatory Visit (HOSPITAL_BASED_OUTPATIENT_CLINIC_OR_DEPARTMENT_OTHER): Payer: BC Managed Care – PPO | Admitting: *Deleted

## 2023-01-15 ENCOUNTER — Encounter: Payer: BC Managed Care – PPO | Admitting: Certified Nurse Midwife

## 2023-01-15 ENCOUNTER — Ambulatory Visit: Payer: BC Managed Care – PPO | Attending: Obstetrics and Gynecology | Admitting: *Deleted

## 2023-01-15 VITALS — BP 127/72 | HR 90

## 2023-01-15 DIAGNOSIS — Z3A34 34 weeks gestation of pregnancy: Secondary | ICD-10-CM | POA: Insufficient documentation

## 2023-01-15 DIAGNOSIS — O24415 Gestational diabetes mellitus in pregnancy, controlled by oral hypoglycemic drugs: Secondary | ICD-10-CM | POA: Diagnosis not present

## 2023-01-15 DIAGNOSIS — O10913 Unspecified pre-existing hypertension complicating pregnancy, third trimester: Secondary | ICD-10-CM

## 2023-01-15 DIAGNOSIS — O24419 Gestational diabetes mellitus in pregnancy, unspecified control: Secondary | ICD-10-CM | POA: Diagnosis present

## 2023-01-15 NOTE — Addendum Note (Signed)
Addended by: Ala Bent on: 01/15/2023 11:17 AM   Modules accepted: Orders

## 2023-01-15 NOTE — Procedures (Signed)
Kristine Johnson 13-Dec-1985 [redacted]w[redacted]d  Fetus A Non-Stress Test Interpretation for 01/15/23 NST only  Indication: Gestational Diabetes medication controlled  Fetal Heart Rate A Mode: External Baseline Rate (A): 140 bpm Variability: Moderate Accelerations: 15 x 15 Decelerations: None Multiple birth?: No  Uterine Activity Mode: Palpation, Toco Contraction Frequency (min): ui Resting Tone Palpated: Relaxed Resting Time: Adequate  Interpretation (Fetal Testing) Nonstress Test Interpretation: Reactive Overall Impression: Reassuring for gestational age Comments: Dr. Darra Lis reviewed tracing.

## 2023-01-16 ENCOUNTER — Encounter (HOSPITAL_COMMUNITY): Payer: Self-pay | Admitting: Obstetrics & Gynecology

## 2023-01-16 ENCOUNTER — Inpatient Hospital Stay (HOSPITAL_COMMUNITY)
Admission: AD | Admit: 2023-01-16 | Discharge: 2023-01-16 | Disposition: A | Discharge status: Home / Self Care | Payer: BC Managed Care – PPO | Attending: Obstetrics & Gynecology | Admitting: Obstetrics & Gynecology

## 2023-01-16 DIAGNOSIS — O10913 Unspecified pre-existing hypertension complicating pregnancy, third trimester: Secondary | ICD-10-CM | POA: Diagnosis not present

## 2023-01-16 DIAGNOSIS — R519 Headache, unspecified: Secondary | ICD-10-CM | POA: Diagnosis not present

## 2023-01-16 DIAGNOSIS — Z3A34 34 weeks gestation of pregnancy: Secondary | ICD-10-CM

## 2023-01-16 DIAGNOSIS — O26893 Other specified pregnancy related conditions, third trimester: Secondary | ICD-10-CM

## 2023-01-16 DIAGNOSIS — Z3480 Encounter for supervision of other normal pregnancy, unspecified trimester: Secondary | ICD-10-CM

## 2023-01-16 DIAGNOSIS — O10919 Unspecified pre-existing hypertension complicating pregnancy, unspecified trimester: Secondary | ICD-10-CM

## 2023-01-16 LAB — CBC
HCT: 34.4 % — ABNORMAL LOW (ref 36.0–46.0)
Hemoglobin: 10.9 g/dL — ABNORMAL LOW (ref 12.0–15.0)
MCH: 27.2 pg (ref 26.0–34.0)
MCHC: 31.7 g/dL (ref 30.0–36.0)
MCV: 85.8 fL (ref 80.0–100.0)
Platelets: 238 10*3/uL (ref 150–400)
RBC: 4.01 MIL/uL (ref 3.87–5.11)
RDW: 15.1 % (ref 11.5–15.5)
WBC: 5.6 10*3/uL (ref 4.0–10.5)
nRBC: 0 % (ref 0.0–0.2)

## 2023-01-16 LAB — URINALYSIS, ROUTINE W REFLEX MICROSCOPIC
Bilirubin Urine: NEGATIVE
Glucose, UA: NEGATIVE mg/dL
Hgb urine dipstick: NEGATIVE
Ketones, ur: NEGATIVE mg/dL
Leukocytes,Ua: NEGATIVE
Nitrite: NEGATIVE
Protein, ur: NEGATIVE mg/dL
Specific Gravity, Urine: 1.021 (ref 1.005–1.030)
pH: 5 (ref 5.0–8.0)

## 2023-01-16 LAB — COMPREHENSIVE METABOLIC PANEL
ALT: 24 U/L (ref 0–44)
AST: 22 U/L (ref 15–41)
Albumin: 2.6 g/dL — ABNORMAL LOW (ref 3.5–5.0)
Alkaline Phosphatase: 92 U/L (ref 38–126)
Anion gap: 10 (ref 5–15)
BUN: 7 mg/dL (ref 6–20)
CO2: 23 mmol/L (ref 22–32)
Calcium: 8.9 mg/dL (ref 8.9–10.3)
Chloride: 104 mmol/L (ref 98–111)
Creatinine, Ser: 0.66 mg/dL (ref 0.44–1.00)
GFR, Estimated: >60 mL/min (ref >60)
Glucose, Bld: 90 mg/dL (ref 70–99)
Potassium: 3.8 mmol/L (ref 3.5–5.1)
Sodium: 137 mmol/L (ref 135–145)
Total Bilirubin: 0.3 mg/dL (ref 0.3–1.2)
Total Protein: 6.2 g/dL — ABNORMAL LOW (ref 6.5–8.1)

## 2023-01-16 LAB — PROTEIN / CREATININE RATIO, URINE
Creatinine, Urine: 194 mg/dL
Protein Creatinine Ratio: 0.1 mg/mg{Cre} (ref 0.00–0.15)
Total Protein, Urine: 19 mg/dL

## 2023-01-16 NOTE — MAU Provider Note (Signed)
History     CSN: [id number]  Arrival date and time: 01/16/23 1526   Event Date/Time   First Provider Initiated Contact with Patient 01/16/23 1658      Chief Complaint  Patient presents with  . Hypertension   Ms. Kristine Johnson is a 37 y.o. year old G27P0010 female at [redacted]w[redacted]d weeks gestation who presents to MAU reporting BP at home was 148/86 and a "slight" H/A (rated 5/10) all afternoon. She takes Procardia for [employer]. She took Tylenol  for the H/A that did not help initially, "but the H/A seems to be easing off now." She also reports that she had BH ctxs every 5-10 mins before she got to MAU, but they slowed down now.   {GYN/OB [id number]  Past Medical History:  Diagnosis Date  . Asthma   . Dysmenorrhea 11/04/2020  . Migraine   . Prediabetes 08/14/2022   Needs early 2 hour  . Vaginal Pap smear, abnormal     Past Surgical History:  Procedure Laterality Date  . HERNIA REPAIR  12/1996  . TONSILLECTOMY  2014  . WISDOM TOOTH EXTRACTION      Family History  Problem Relation Age of Onset  . Diabetes Father     Social History   Tobacco Use  . Smoking status: Never  . Smokeless tobacco: Never  Vaping Use  . Vaping status: Never Used  Substance Use Topics  . Alcohol use: Not Currently    Comment: socially   . Drug use: Not Currently    Allergies:  Allergies  Allergen Reactions  . Covid-19 Ad26 Vaccine(Janssen) Anaphylaxis    Medications Prior to Admission  Medication Sig Dispense Refill Last Dose  . acetaminophen (TYLENOL) 325 MG tablet Take 650 mg by mouth every 6 (six) hours as needed. As needed   01/16/2023 at 1000  . Ferrous Gluconate 324 (37.5 Fe) MG TABS Take 1 tablet (324 mg total) by mouth daily. 30 tablet 2 01/15/2023 at 1000  . NIFEdipine (PROCARDIA XL) 30 MG 24 hr tablet Take 1 tablet (30 mg total) by mouth daily. 90 tablet 2 01/15/2023 at 2000  . Prenatal 27-1 MG TABS Take 1 tablet by mouth daily. 30 tablet 11 01/16/2023 at 1400  . Accu-Chek  Softclix Lancets lancets Use as instructed; check blood glucose 4 times daily 100 each 12   . albuterol (VENTOLIN HFA) 108 (90 Base) MCG/ACT inhaler Inhale 1-2 puffs into the lungs every 6 (six) hours as needed for shortness of breath or wheezing. 18 g 0 More than a month  . aspirin 81 MG chewable tablet Chew 1 tablet (81 mg total) by mouth daily. 90 tablet 3 t at 1400  . Blood Pressure Monitoring DEVI 1 each by Does not apply route once a week. 1 each 0   . glucose blood (ACCU-CHEK GUIDE) test strip Use as instructed; check blood glucose 4 times daily 100 each 12   . metFORMIN (GLUCOPHAGE) 500 MG tablet Take 1 tablet (500 mg total) by mouth 2 (two) times daily with a meal. 60 tablet 2 More than a month  . omeprazole (PRILOSEC OTC) 20 MG tablet Take 1 tablet (20 mg total) by mouth daily. 90 tablet 3 More than a month  . promethazine (PHENERGAN) 25 MG tablet Take 1 tablet (25 mg total) by mouth every 6 (six) hours as needed for nausea or vomiting. 30 tablet 0 More than a month    Review of Systems Physical Exam  Patient Vitals for the past 24 hrs:  BP Temp Temp src Pulse Resp SpO2 Height Weight  01/16/23 1640 -- -- -- -- -- 100 % -- --  01/16/23 1621 119/78 98.2 F (36.8 C) Oral (!) 106 18 100 % -- --  01/16/23 1602 123/79 98 F (36.7 C) -- 100 17 100 % 5' 1.5" (1.562 m) 110 kg     Physical Exam Vitals and nursing note reviewed.  Constitutional:      Appearance: Normal appearance. She is obese.  HENT:     Head: Normocephalic and atraumatic.  Eyes:     Extraocular Movements: Extraocular movements intact.     Conjunctiva/sclera: Conjunctivae normal.     Pupils: Pupils are equal, round, and reactive to light.  Cardiovascular:     Rate and Rhythm: Tachycardia present.  Genitourinary:    Comments: Not indicated Musculoskeletal:        General: Normal range of motion.  Neurological:     Mental Status: She is alert and oriented to person, place, and time.  Psychiatric:        Mood and  Affect: Mood normal.        Behavior: Behavior normal.        Thought Content: Thought content normal.        Judgment: Judgment normal.   ***REACTIVE NST - FHR: *** bpm / moderate variability / accels present / decels absent / TOCO: regular every *** mins  MAU Course  Procedures  MDM CCUA CBC CMP P/C Ratio Serial BP's  Offered Excedrin Tension H/A tablets -- patient declined, feeling like H/A is tolerable enough without and "I have Tylenol at home  Results for orders placed or performed during the hospital encounter of 01/16/23 (from the past 24 hour(s))  Urinalysis, Routine w reflex microscopic -Urine, Clean Catch     Status: Abnormal   Collection Time: 01/16/23  4:28 PM  Result Value Ref Range   Color, Urine AMBER (A) YELLOW   APPearance HAZY (A) CLEAR   Specific Gravity, Urine 1.021 1.005 - 1.030   pH 5.0 5.0 - 8.0   Glucose, UA NEGATIVE NEGATIVE mg/dL   Hgb urine dipstick NEGATIVE NEGATIVE   Bilirubin Urine NEGATIVE NEGATIVE   Ketones, ur NEGATIVE NEGATIVE mg/dL   Protein, ur NEGATIVE NEGATIVE mg/dL   Nitrite NEGATIVE NEGATIVE   Leukocytes,Ua NEGATIVE NEGATIVE      Assessment and Plan  ***  Raelyn Mora, CNM 01/16/2023, 4:58 PM

## 2023-01-16 NOTE — MAU Note (Signed)
.  Kristine Johnson is a 37 y.o. at [redacted]w[redacted]d here in MAU reporting: elevated b/p 148/86 . Taking nifedipine for chronic Hypertension. Reports slight headache all afternoon took tylenol has not helped yet. Having bxt hicks ctx q 5-10 min but those have slowed down some. Good fetal movement   Onset of complaint: today Pain score: 5 Vitals:   01/16/23 1602  BP: 123/79  Pulse: 100  Resp: 17  Temp: 98 F (36.7 C)  SpO2: 100%     FHT:141 Lab orders placed from triage:  u/a

## 2023-01-16 NOTE — MAU Note (Signed)
Came in today because BP was elevated at 1415 at 148/86 and has h/a.

## 2023-01-22 ENCOUNTER — Ambulatory Visit: Payer: BC Managed Care – PPO | Admitting: *Deleted

## 2023-01-22 ENCOUNTER — Ambulatory Visit (HOSPITAL_BASED_OUTPATIENT_CLINIC_OR_DEPARTMENT_OTHER): Payer: BC Managed Care – PPO | Admitting: *Deleted

## 2023-01-22 VITALS — BP 123/72 | HR 99

## 2023-01-22 DIAGNOSIS — Z6841 Body Mass Index (BMI) 40.0 and over, adult: Secondary | ICD-10-CM

## 2023-01-22 DIAGNOSIS — Z3A35 35 weeks gestation of pregnancy: Secondary | ICD-10-CM

## 2023-01-22 DIAGNOSIS — O10913 Unspecified pre-existing hypertension complicating pregnancy, third trimester: Secondary | ICD-10-CM | POA: Diagnosis not present

## 2023-01-22 DIAGNOSIS — E669 Obesity, unspecified: Secondary | ICD-10-CM

## 2023-01-22 DIAGNOSIS — O99213 Obesity complicating pregnancy, third trimester: Secondary | ICD-10-CM | POA: Diagnosis not present

## 2023-01-22 DIAGNOSIS — O24419 Gestational diabetes mellitus in pregnancy, unspecified control: Secondary | ICD-10-CM

## 2023-01-22 DIAGNOSIS — O24415 Gestational diabetes mellitus in pregnancy, controlled by oral hypoglycemic drugs: Secondary | ICD-10-CM

## 2023-01-22 DIAGNOSIS — O10013 Pre-existing essential hypertension complicating pregnancy, third trimester: Secondary | ICD-10-CM

## 2023-01-22 DIAGNOSIS — O10919 Unspecified pre-existing hypertension complicating pregnancy, unspecified trimester: Secondary | ICD-10-CM

## 2023-01-22 DIAGNOSIS — O99210 Obesity complicating pregnancy, unspecified trimester: Secondary | ICD-10-CM

## 2023-01-22 NOTE — Procedures (Signed)
Kristine Johnson 1986-04-26 [redacted]w[redacted]d  Fetus A Non-Stress Test Interpretation for 01/22/23 (NST only)  Indication: Chronic Hypertenstion, Gestational Diabetes medication controlled, and Obesity  Fetal Heart Rate A Mode: External Baseline Rate (A): 140 bpm Variability: Moderate Accelerations: 15 x 15 Decelerations: None Multiple birth?: No  Uterine Activity Mode: Palpation, Toco Contraction Frequency (min): Occas w/UI Contraction Duration (sec): 10-40 Contraction Quality: Mild Resting Tone Palpated: Relaxed Resting Time: Adequate  Interpretation (Fetal Testing) Nonstress Test Interpretation: Reactive Comments: Dr. Darra Lis reviewed tracing.

## 2023-01-27 ENCOUNTER — Ambulatory Visit (INDEPENDENT_AMBULATORY_CARE_PROVIDER_SITE_OTHER): Payer: BC Managed Care – PPO | Admitting: Obstetrics and Gynecology

## 2023-01-27 ENCOUNTER — Other Ambulatory Visit (HOSPITAL_COMMUNITY)
Admission: RE | Admit: 2023-01-27 | Discharge: 2023-01-27 | Disposition: A | Discharge status: Home / Self Care | Payer: BC Managed Care – PPO | Source: Ambulatory Visit | Attending: Obstetrics and Gynecology | Admitting: Obstetrics and Gynecology

## 2023-01-27 ENCOUNTER — Other Ambulatory Visit: Payer: Self-pay

## 2023-01-27 VITALS — BP 122/80 | HR 92 | Wt 242.7 lb

## 2023-01-27 DIAGNOSIS — D259 Leiomyoma of uterus, unspecified: Secondary | ICD-10-CM

## 2023-01-27 DIAGNOSIS — Z3480 Encounter for supervision of other normal pregnancy, unspecified trimester: Secondary | ICD-10-CM

## 2023-01-27 DIAGNOSIS — O99210 Obesity complicating pregnancy, unspecified trimester: Secondary | ICD-10-CM

## 2023-01-27 DIAGNOSIS — O10919 Unspecified pre-existing hypertension complicating pregnancy, unspecified trimester: Secondary | ICD-10-CM

## 2023-01-27 DIAGNOSIS — O10913 Unspecified pre-existing hypertension complicating pregnancy, third trimester: Secondary | ICD-10-CM

## 2023-01-27 DIAGNOSIS — D563 Thalassemia minor: Secondary | ICD-10-CM

## 2023-01-27 DIAGNOSIS — O24410 Gestational diabetes mellitus in pregnancy, diet controlled: Secondary | ICD-10-CM

## 2023-01-27 DIAGNOSIS — Z3A35 35 weeks gestation of pregnancy: Secondary | ICD-10-CM

## 2023-01-27 DIAGNOSIS — Z6841 Body Mass Index (BMI) 40.0 and over, adult: Secondary | ICD-10-CM

## 2023-01-27 DIAGNOSIS — O99213 Obesity complicating pregnancy, third trimester: Secondary | ICD-10-CM

## 2023-01-27 NOTE — Progress Notes (Signed)
   PRENATAL VISIT NOTE  Subjective:  Kristine Johnson is a 37 y.o. G2P0010 at [redacted]w[redacted]d being seen today for ongoing prenatal care.  She is currently monitored for the following issues for this high-risk pregnancy and has Low grade squamous intraepithelial lesion (LGSIL) on cervical Pap smear; Uterine leiomyoma; Supervision of other normal pregnancy, antepartum; Alpha thalassemia silent carrier; Gestational diabetes mellitus (GDM) in third trimester; BMI 40.0-44.9, adult (HCC); Chronic hypertension in pregnancy; and Obesity in pregnancy on their problem list.  Patient doing well with no acute concerns today. She reports no complaints.  Contractions: Irritability. Vag. Bleeding: None.  Movement: Present. Denies leaking of fluid.   The following portions of the patient's history were reviewed and updated as appropriate: allergies, current medications, past family history, past medical history, past social history, past surgical history and problem list. Problem list updated.  Objective:   Vitals:   01/27/23 1650  BP: 122/80  Pulse: 92  Weight: 242 lb 11.2 oz (110.1 kg)    Fetal Status: Fetal Heart Rate (bpm): 139 Fundal Height: 37 cm Movement: Present     General:  Alert, oriented and cooperative. Patient is in no acute distress.  Skin: Skin is warm and dry. No rash noted.   Cardiovascular: Normal heart rate noted  Respiratory: Normal respiratory effort, no problems with respiration noted  Abdomen: Soft, gravid, appropriate for gestational age.  Pain/Pressure: Present     Pelvic: Cervical exam performed Dilation: Fingertip Effacement (%): 60 Station: -3  Extremities: Normal range of motion.  Edema: Trace  Mental Status:  Normal mood and affect. Normal behavior. Normal judgment and thought content.   Assessment and Plan:  Pregnancy: G2P0010 at [redacted]w[redacted]d  1. [redacted] weeks gestation of pregnancy   2. Chronic hypertension in pregnancy Good BP control  3. Diet controlled gestational  diabetes mellitus (GDM) in third trimester FBS: 91-97 most in range FBS: 98-118  4. Uterine leiomyoma, unspecified location   5. Alpha thalassemia silent carrier   6. BMI 40.0-44.9, adult (HCC)   7. Obesity in pregnancy   8. Supervision of other normal pregnancy, antepartum Continue routine prenatal care  Preterm labor symptoms and general obstetric precautions including but not limited to vaginal bleeding, contractions, leaking of fluid and fetal movement were reviewed in detail with the patient.  Please refer to After Visit Summary for other counseling recommendations.   Return in about 1 week (around 02/03/2023) for in person.   Mariel Aloe, MD Faculty Attending Center for [facility]

## 2023-01-29 ENCOUNTER — Ambulatory Visit: Payer: BC Managed Care – PPO | Admitting: *Deleted

## 2023-01-29 ENCOUNTER — Other Ambulatory Visit: Payer: Self-pay | Admitting: *Deleted

## 2023-01-29 ENCOUNTER — Ambulatory Visit: Payer: BC Managed Care – PPO | Attending: Maternal & Fetal Medicine

## 2023-01-29 VITALS — BP 142/78 | HR 85

## 2023-01-29 DIAGNOSIS — D259 Leiomyoma of uterus, unspecified: Secondary | ICD-10-CM | POA: Insufficient documentation

## 2023-01-29 DIAGNOSIS — O10913 Unspecified pre-existing hypertension complicating pregnancy, third trimester: Secondary | ICD-10-CM | POA: Insufficient documentation

## 2023-01-29 DIAGNOSIS — O3413 Maternal care for benign tumor of corpus uteri, third trimester: Secondary | ICD-10-CM | POA: Insufficient documentation

## 2023-01-29 DIAGNOSIS — Z3A36 36 weeks gestation of pregnancy: Secondary | ICD-10-CM

## 2023-01-29 DIAGNOSIS — O99213 Obesity complicating pregnancy, third trimester: Secondary | ICD-10-CM | POA: Insufficient documentation

## 2023-01-29 DIAGNOSIS — O24419 Gestational diabetes mellitus in pregnancy, unspecified control: Secondary | ICD-10-CM | POA: Diagnosis not present

## 2023-01-29 DIAGNOSIS — D563 Thalassemia minor: Secondary | ICD-10-CM | POA: Diagnosis present

## 2023-01-29 DIAGNOSIS — O24410 Gestational diabetes mellitus in pregnancy, diet controlled: Secondary | ICD-10-CM

## 2023-01-29 DIAGNOSIS — O10013 Pre-existing essential hypertension complicating pregnancy, third trimester: Secondary | ICD-10-CM

## 2023-01-29 DIAGNOSIS — O285 Abnormal chromosomal and genetic finding on antenatal screening of mother: Secondary | ICD-10-CM

## 2023-01-29 DIAGNOSIS — E669 Obesity, unspecified: Secondary | ICD-10-CM

## 2023-02-01 NOTE — Progress Notes (Signed)
[place], Babyscripts  P [facility]-Babys[facility]s Htn Md; P [facility] Clinical Pool Babyscripts Trigger Notification for [place], Maurica Trigger Severity: Elevated Blood Pressure Value: 154/94 Reading Date: 2023-01-31 Symptoms: Persistent Headache   Babyscripts review shows recheck on 01/31/23 @ 1016 125/77. Will return 02/04/23 for OB visit.

## 2023-02-04 ENCOUNTER — Other Ambulatory Visit: Payer: Self-pay

## 2023-02-04 ENCOUNTER — Ambulatory Visit (INDEPENDENT_AMBULATORY_CARE_PROVIDER_SITE_OTHER): Payer: BC Managed Care – PPO | Admitting: Obstetrics and Gynecology

## 2023-02-04 ENCOUNTER — Ambulatory Visit (INDEPENDENT_AMBULATORY_CARE_PROVIDER_SITE_OTHER): Payer: BC Managed Care – PPO

## 2023-02-04 ENCOUNTER — Encounter: Payer: Self-pay | Admitting: Obstetrics and Gynecology

## 2023-02-04 VITALS — BP 121/82 | HR 88 | Wt 243.0 lb

## 2023-02-04 DIAGNOSIS — Z3480 Encounter for supervision of other normal pregnancy, unspecified trimester: Secondary | ICD-10-CM

## 2023-02-04 DIAGNOSIS — O10919 Unspecified pre-existing hypertension complicating pregnancy, unspecified trimester: Secondary | ICD-10-CM

## 2023-02-04 DIAGNOSIS — O24419 Gestational diabetes mellitus in pregnancy, unspecified control: Secondary | ICD-10-CM | POA: Diagnosis not present

## 2023-02-04 DIAGNOSIS — O99213 Obesity complicating pregnancy, third trimester: Secondary | ICD-10-CM | POA: Diagnosis not present

## 2023-02-04 DIAGNOSIS — O10913 Unspecified pre-existing hypertension complicating pregnancy, third trimester: Secondary | ICD-10-CM | POA: Diagnosis not present

## 2023-02-04 DIAGNOSIS — Z6841 Body Mass Index (BMI) 40.0 and over, adult: Secondary | ICD-10-CM

## 2023-02-04 DIAGNOSIS — Z3A36 36 weeks gestation of pregnancy: Secondary | ICD-10-CM

## 2023-02-04 DIAGNOSIS — O24410 Gestational diabetes mellitus in pregnancy, diet controlled: Secondary | ICD-10-CM

## 2023-02-04 DIAGNOSIS — Z3490 Encounter for supervision of normal pregnancy, unspecified, unspecified trimester: Secondary | ICD-10-CM

## 2023-02-04 DIAGNOSIS — D563 Thalassemia minor: Secondary | ICD-10-CM

## 2023-02-04 DIAGNOSIS — G43109 Migraine with aura, not intractable, without status migrainosus: Secondary | ICD-10-CM | POA: Insufficient documentation

## 2023-02-04 DIAGNOSIS — Z0289 Encounter for other administrative examinations: Secondary | ICD-10-CM

## 2023-02-04 NOTE — Progress Notes (Signed)
Home BP yesterday was 161/91, recheck 148/95. Did have headache yesterday, did not take any medication. Mild headache this AM. BP 121/82 in office today.

## 2023-02-04 NOTE — Progress Notes (Signed)
   PRENATAL VISIT NOTE  Subjective:  Kristine Johnson is a 37 y.o. G2P0010 at [redacted]w[redacted]d being seen today for ongoing prenatal care.  She is currently monitored for the following issues for this high-risk pregnancy and has Low grade squamous intraepithelial lesion (LGSIL) on cervical Pap smear; Uterine leiomyoma; Supervision of other normal pregnancy, antepartum; Alpha thalassemia silent carrier; Gestational diabetes mellitus (GDM) in third trimester; BMI 40.0-44.9, adult (HCC); Chronic hypertension in pregnancy; Obesity in pregnancy; and Migraine with aura and without status migrainosus, not intractable on their problem list.  Patient doing well with no acute concerns today. She reports  intermittent visual changes, has been present several weeks .  Contractions: Irritability. Vag. Bleeding: None.  Movement: Present. Denies leaking of fluid.   The following portions of the patient's history were reviewed and updated as appropriate: allergies, current medications, past family history, past medical history, past social history, past surgical history and problem list. Problem list updated.  Objective:   Vitals:   02/04/23 0912  BP: 121/82  Pulse: 88  Weight: 243 lb (110.2 kg)    Fetal Status: Fetal Heart Rate (bpm): 145 Fundal Height: 37 cm Movement: Present     General:  Alert, oriented and cooperative. Patient is in no acute distress.  Skin: Skin is warm and dry. No rash noted.   Cardiovascular: Normal heart rate noted  Respiratory: Normal respiratory effort, no problems with respiration noted  Abdomen: Soft, gravid, appropriate for gestational age.  Pain/Pressure: Present     Pelvic: Cervical exam deferred        Extremities: Normal range of motion.  Edema: None  Mental Status:  Normal mood and affect. Normal behavior. Normal judgment and thought content.   Assessment and Plan:  Pregnancy: G2P0010 at [redacted]w[redacted]d  1. [redacted] weeks gestation of pregnancy   2. Supervision of other normal  pregnancy, antepartum Continue routine prenatal care  3. Chronic hypertension in pregnancy Blood pressure currently controlled No headache or RUQ pain today, pt notes intermittent visual changes but I am somewhat ambivalent with the symptomatology IOL scheduled for 02/14/23 4. Diet controlled gestational diabetes mellitus (GDM) in third trimester FBS: 85-103  only one out of range PPBS: 97-122  5. Alpha thalassemia silent carrier   6. BMI 40.0-44.9, adult [facility])   Term labor symptoms and general obstetric precautions including but not limited to vaginal bleeding, contractions, leaking of fluid and fetal movement were reviewed in detail with the patient.  Please refer to After Visit Summary for other counseling recommendations.   Return in about 1 week (around 02/11/2023) for [facility], in person. With BPP  Mariel Aloe, MD Faculty Attending Center for [facility]

## 2023-02-08 ENCOUNTER — Other Ambulatory Visit (HOSPITAL_COMMUNITY): Payer: Self-pay | Admitting: Advanced Practice Midwife

## 2023-02-08 ENCOUNTER — Encounter (HOSPITAL_COMMUNITY): Payer: Self-pay | Admitting: Obstetrics and Gynecology

## 2023-02-08 ENCOUNTER — Inpatient Hospital Stay (HOSPITAL_COMMUNITY)
Admission: AD | Admit: 2023-02-08 | Discharge: 2023-02-08 | Disposition: A | Discharge status: Home / Self Care | Payer: BC Managed Care – PPO | Attending: Obstetrics and Gynecology | Admitting: Obstetrics and Gynecology

## 2023-02-08 ENCOUNTER — Encounter: Payer: Self-pay | Admitting: *Deleted

## 2023-02-08 ENCOUNTER — Ambulatory Visit (INDEPENDENT_AMBULATORY_CARE_PROVIDER_SITE_OTHER): Payer: BC Managed Care – PPO | Admitting: General Practice

## 2023-02-08 ENCOUNTER — Telehealth: Payer: Self-pay | Admitting: *Deleted

## 2023-02-08 VITALS — BP 131/84 | HR 103

## 2023-02-08 DIAGNOSIS — O133 Gestational [pregnancy-induced] hypertension without significant proteinuria, third trimester: Secondary | ICD-10-CM | POA: Insufficient documentation

## 2023-02-08 DIAGNOSIS — Z0130 Encounter for examination of blood pressure without abnormal findings: Secondary | ICD-10-CM

## 2023-02-08 DIAGNOSIS — Z3A37 37 weeks gestation of pregnancy: Secondary | ICD-10-CM | POA: Diagnosis not present

## 2023-02-08 DIAGNOSIS — O26893 Other specified pregnancy related conditions, third trimester: Secondary | ICD-10-CM | POA: Diagnosis not present

## 2023-02-08 DIAGNOSIS — Z3480 Encounter for supervision of other normal pregnancy, unspecified trimester: Secondary | ICD-10-CM

## 2023-02-08 LAB — COMPREHENSIVE METABOLIC PANEL
ALT: 25 U/L (ref 0–44)
AST: 23 U/L (ref 15–41)
Albumin: 2.5 g/dL — ABNORMAL LOW (ref 3.5–5.0)
Alkaline Phosphatase: 107 U/L (ref 38–126)
Anion gap: 9 (ref 5–15)
BUN: 6 mg/dL (ref 6–20)
CO2: 21 mmol/L — ABNORMAL LOW (ref 22–32)
Calcium: 8.8 mg/dL — ABNORMAL LOW (ref 8.9–10.3)
Chloride: 104 mmol/L (ref 98–111)
Creatinine, Ser: 0.8 mg/dL (ref 0.44–1.00)
GFR, Estimated: >60 mL/min (ref >60)
Glucose, Bld: 127 mg/dL — ABNORMAL HIGH (ref 70–99)
Potassium: 3.6 mmol/L (ref 3.5–5.1)
Sodium: 134 mmol/L — ABNORMAL LOW (ref 135–145)
Total Bilirubin: 0.5 mg/dL (ref 0.3–1.2)
Total Protein: 6.4 g/dL — ABNORMAL LOW (ref 6.5–8.1)

## 2023-02-08 LAB — CBC
HCT: 35.3 % — ABNORMAL LOW (ref 36.0–46.0)
Hemoglobin: 11.4 g/dL — ABNORMAL LOW (ref 12.0–15.0)
MCH: 26.5 pg (ref 26.0–34.0)
MCHC: 32.3 g/dL (ref 30.0–36.0)
MCV: 81.9 fL (ref 80.0–100.0)
Platelets: 239 10*3/uL (ref 150–400)
RBC: 4.31 MIL/uL (ref 3.87–5.11)
RDW: 15 % (ref 11.5–15.5)
WBC: 5.5 10*3/uL (ref 4.0–10.5)
nRBC: 0 % (ref 0.0–0.2)

## 2023-02-08 LAB — PROTEIN / CREATININE RATIO, URINE
Creatinine, Urine: 177 mg/dL
Protein Creatinine Ratio: 0.11 mg/mg{Cre} (ref 0.00–0.15)
Total Protein, Urine: 19 mg/dL

## 2023-02-08 MED ORDER — ACETAMINOPHEN-CAFFEINE 500-65 MG PO TABS
1.0000 | ORAL_TABLET | Freq: Once | ORAL | Status: AC
  Administered 2023-02-08: 1 via ORAL
  Filled 2023-02-08: qty 1

## 2023-02-08 NOTE — Telephone Encounter (Signed)
Received babyscripts alerts x2 re: elevated BP. Pt with hx CHTN on Procardia 30 daily, now [redacted]w[redacted]d with BP alert of 141/82 and 153/93. No symptoms. Discussed with Dr. Alvester Morin, advices nurse visit bp check or provider visit. I called [place] and left a message I am calling to follow up with her re: babyscripts alert and will send detailed myChart message for her to read and respond to. Nancy Fetter

## 2023-02-08 NOTE — Progress Notes (Signed)
Patient presents to office today for BP check after elevated reading at home of 140s/90s. She reports a headache since last night, rated at a 6 with blurry vision and seeing spots. She took extra strength tylenol this morning and took a nap with minimal relief. Trace edema noted. Patient reports increasing headaches with visual changes over the past couple of weeks. Patient appears uncomfortable with eyes squinted. Discussed with Dr Alvester Morin who recommends evaluation in MAU. Discussed with patient. Patient verbalized understanding & plan to go to MAU.   Chase Caller RN  BSN 02/08/23

## 2023-02-08 NOTE — MAU Note (Signed)
.  Kristine Johnson is a 37 y.o. at [redacted]w[redacted]d here in MAU reporting: headache and elevated bp at prenatal apt in clinic.   Onset of complaint: 02/07/2023 Pain score: 3 Vitals:   02/08/23 1719  BP: 132/79  Pulse: (!) 105     FHT:145, normal fetal movement, no spont rupture of membranes.  Lab orders placed from triage:

## 2023-02-08 NOTE — MAU Provider Note (Addendum)
History     CSN: [id number]  Arrival date and time: 02/08/23 1653   Event Date/Time   First Provider Initiated Contact with Patient 02/08/23 1824      Chief Complaint  Patient presents with   Headache    Headache started 8/18 PM. Patient states she has had elevated BP all weekend. Normal fetal movement, no rupture of membranes.    HPI:  Kristine Johnson is a 37 y.o. G2P0010 at [redacted]w[redacted]d presents for headache and vision changes. Her BP has been measured through BabyScripts app -- she had a BP of 153/93 yesterday and a measurement of 145/95 this morning. She was advised to present to clinic today, where her Bps were 132/79 and 132/87. She endorsed a headache which started last night, and woke up this morning with headache still. No vision changes. She denies any CP, palpitations, SOB, edema, RUQ pain. She reports her headache is better now -- rates it as a 3/10.   Past Medical History:  Diagnosis Date   Asthma    Dysmenorrhea 11/04/2020   Migraine    Prediabetes 08/14/2022   Needs early 2 hour   Vaginal Pap smear, abnormal     Past Surgical History:  Procedure Laterality Date   HERNIA REPAIR  12/1996   TONSILLECTOMY  2014   WISDOM TOOTH EXTRACTION      Family History  Problem Relation Age of Onset   Diabetes Father     Social History   Tobacco Use   Smoking status: Never   Smokeless tobacco: Never  Vaping Use   Vaping status: Never Used  Substance Use Topics   Alcohol use: Not Currently    Comment: socially    Drug use: Not Currently    Allergies:  Allergies  Allergen Reactions   Covid-19 Ad26 Vaccine(Janssen) Anaphylaxis    Medications Prior to Admission  Medication Sig Dispense Refill Last Dose   Accu-Chek Softclix Lancets lancets Use as instructed; check blood glucose 4 times daily 100 each 12 02/08/2023   acetaminophen (TYLENOL) 325 MG tablet Take 650 mg by mouth every 6 (six) hours as needed. As needed   02/08/2023   aspirin 81 MG chewable tablet  Chew 1 tablet (81 mg total) by mouth daily. 90 tablet 3 02/07/2023   Blood Pressure Monitoring DEVI 1 each by Does not apply route once a week. 1 each 0 02/08/2023   Ferrous Gluconate 324 (37.5 Fe) MG TABS Take 1 tablet (324 mg total) by mouth daily. 30 tablet 2 02/07/2023   glucose blood (ACCU-CHEK GUIDE) test strip Use as instructed; check blood glucose 4 times daily 100 each 12 02/08/2023   NIFEdipine (PROCARDIA XL) 30 MG 24 hr tablet Take 1 tablet (30 mg total) by mouth daily. 90 tablet 2 02/07/2023   Prenatal 27-1 MG TABS Take 1 tablet by mouth daily. 30 tablet 11 02/07/2023   albuterol (VENTOLIN HFA) 108 (90 Base) MCG/ACT inhaler Inhale 1-2 puffs into the lungs every 6 (six) hours as needed for shortness of breath or wheezing. 18 g 0 More than a month    Review of Systems  Constitutional:  Negative for chills and fever.  HENT:  Negative for congestion.   Eyes:  Negative for photophobia and visual disturbance.  Respiratory:  Negative for cough, chest tightness, shortness of breath and wheezing.   Cardiovascular:  Negative for chest pain, palpitations and leg swelling.  Gastrointestinal:  Negative for abdominal pain, constipation, diarrhea, nausea and vomiting.  Genitourinary:  Negative for dysuria, frequency, urgency,  vaginal bleeding and vaginal discharge.  Musculoskeletal:  Negative for back pain.  Skin:  Negative for rash.  Neurological:  Positive for headaches. Negative for dizziness and numbness.   Physical Exam   Blood pressure 121/78, pulse 99, resp. rate 14, last menstrual period 05/22/2022, SpO2 99%.  Physical Exam Constitutional:      General: She is not in acute distress.    Appearance: She is well-developed. She is not ill-appearing.  HENT:     Head: Normocephalic and atraumatic.  Cardiovascular:     Rate and Rhythm: Normal rate and regular rhythm.     Heart sounds: Normal heart sounds.  Pulmonary:     Effort: Pulmonary effort is normal. No respiratory distress.      Breath sounds: Normal breath sounds.  Abdominal:     Palpations: Abdomen is soft.     Comments: gravid  Skin:    General: Skin is warm and dry.     Findings: No rash.  Neurological:     Mental Status: She is alert.     Cranial Nerves: No cranial nerve deficit or facial asymmetry.  Psychiatric:        Mood and Affect: Mood normal.        Behavior: Behavior normal.   EFM: 140/mod/+a/-d  MAU Course  Procedures  MDM Kristine Johnson is a 37 y.o. G2P0010 at [redacted]w[redacted]d who presents for evaluation for severe pre-e in the setting of elevated BP and headaches. Headache better with Excedrin/Tylenol at home, vision changes not present still. Pt normotensive during admission, CBC/CMP wnl, PCR 0.11. Suspect gestational hypertension. Will d/c home, rec BP monitoring. Close f/up outpatient.   Assessment and Plan  Gestational hypertension, third trimester - Plan: Discharge patient: BP normotensive, ruled out for pre-e. Rec home monitoring, parameters to call back given, return precautions d/w pt in detail, message sent to clinical team to f/up with patient.  Patient discussed with Dr. Debroah Loop.  Kristine Aland, MD OB Fellow, Faculty Practice [facility], Center for [facility]

## 2023-02-09 ENCOUNTER — Telehealth (HOSPITAL_COMMUNITY): Payer: Self-pay | Admitting: *Deleted

## 2023-02-09 ENCOUNTER — Encounter (HOSPITAL_COMMUNITY): Payer: Self-pay | Admitting: *Deleted

## 2023-02-09 NOTE — Telephone Encounter (Signed)
Preadmission screen  

## 2023-02-10 ENCOUNTER — Other Ambulatory Visit: Payer: Self-pay | Admitting: Advanced Practice Midwife

## 2023-02-11 ENCOUNTER — Other Ambulatory Visit: Payer: BC Managed Care – PPO

## 2023-02-11 ENCOUNTER — Other Ambulatory Visit: Payer: Self-pay

## 2023-02-11 ENCOUNTER — Ambulatory Visit: Payer: BC Managed Care – PPO

## 2023-02-11 ENCOUNTER — Ambulatory Visit (INDEPENDENT_AMBULATORY_CARE_PROVIDER_SITE_OTHER): Payer: BC Managed Care – PPO | Admitting: Obstetrics and Gynecology

## 2023-02-11 VITALS — BP 113/68 | HR 89 | Wt 247.0 lb

## 2023-02-11 DIAGNOSIS — O10913 Unspecified pre-existing hypertension complicating pregnancy, third trimester: Secondary | ICD-10-CM

## 2023-02-11 DIAGNOSIS — O99820 Streptococcus B carrier state complicating pregnancy: Secondary | ICD-10-CM

## 2023-02-11 DIAGNOSIS — Z3A37 37 weeks gestation of pregnancy: Secondary | ICD-10-CM

## 2023-02-11 DIAGNOSIS — O24410 Gestational diabetes mellitus in pregnancy, diet controlled: Secondary | ICD-10-CM

## 2023-02-11 DIAGNOSIS — O10919 Unspecified pre-existing hypertension complicating pregnancy, unspecified trimester: Secondary | ICD-10-CM

## 2023-02-11 DIAGNOSIS — Z3480 Encounter for supervision of other normal pregnancy, unspecified trimester: Secondary | ICD-10-CM

## 2023-02-11 NOTE — Progress Notes (Signed)
   PRENATAL VISIT NOTE  Subjective:  Kristine Johnson is a 37 y.o. G2P0010 at [redacted]w[redacted]d being seen today for ongoing prenatal care.  She is currently monitored for the following issues for this high-risk pregnancy and has Low grade squamous intraepithelial lesion (LGSIL) on cervical Pap smear; Uterine leiomyoma; Supervision of other normal pregnancy, antepartum; Alpha thalassemia silent carrier; Gestational diabetes mellitus (GDM) in third trimester; BMI 40.0-44.9, adult (HCC); Chronic hypertension in pregnancy; Obesity in pregnancy; and Migraine with aura and without status migrainosus, not intractable on their problem list.  Patient reports no complaints.  Contractions: Irregular. Vag. Bleeding: None.  Movement: Present. Denies leaking of fluid.   The following portions of the patient's history were reviewed and updated as appropriate: allergies, current medications, past family history, past medical history, past social history, past surgical history and problem list.   Objective:   Vitals:   02/11/23 1331  BP: 113/68  Pulse: 89  Weight: 247 lb (112 kg)    Fetal Status: Fetal Heart Rate (bpm): 140 Fundal Height: 38 cm Movement: Present     General:  Alert, oriented and cooperative. Patient is in no acute distress.  Skin: Skin is warm and dry. No rash noted.   Cardiovascular: Normal heart rate noted  Respiratory: Normal respiratory effort, no problems with respiration noted  Abdomen: Soft, gravid, appropriate for gestational age.  Pain/Pressure: Absent     Pelvic: Cervical exam deferred        Extremities: Normal range of motion.  Edema: None  Mental Status: Normal mood and affect. Normal behavior. Normal judgment and thought content.   Assessment and Plan:  Pregnancy: G2P0010 at [redacted]w[redacted]d 1. Supervision of other normal pregnancy, antepartum BP and FHR normal Feeling regular fetal movement   2. Chronic hypertension in pregnancy 8/19 went to MAU 8/19 pec s&s, work up ruled out  PEC 8/15 BPP 8/8, has BPP later today   3. [redacted] weeks gestation of pregnancy IOL scheduled 8/25 DIscussed IOL methods   4. Diet controlled gestational diabetes mellitus (GDM) in third trimester Fasting majority <95, pp majority <120, controlled with diet  5. Group B Streptococcus carrier, +RV culture, currently pregnant Tx in labor    Term labor symptoms and general obstetric precautions including but not limited to vaginal bleeding, contractions, leaking of fluid and fetal movement were reviewed in detail with the patient. Please refer to After Visit Summary for other counseling recommendations.    Future Appointments  Date Time Provider Department Center  02/11/2023  2:15 PM [facility] US2 [facility]  02/14/2023 12:00 AM [facility] SCHED ROOM [facility] None    Albertine Grates, FNP

## 2023-02-14 ENCOUNTER — Other Ambulatory Visit: Payer: Self-pay

## 2023-02-14 ENCOUNTER — Inpatient Hospital Stay (HOSPITAL_COMMUNITY)
Admission: AD | Admit: 2023-02-14 | Discharge: 2023-02-19 | DRG: 787 | Disposition: A | Discharge status: Home / Self Care | Payer: BC Managed Care – PPO | Attending: Family Medicine | Admitting: Family Medicine

## 2023-02-14 ENCOUNTER — Encounter (HOSPITAL_COMMUNITY): Payer: Self-pay | Admitting: Obstetrics and Gynecology

## 2023-02-14 ENCOUNTER — Inpatient Hospital Stay (HOSPITAL_COMMUNITY): Payer: BC Managed Care – PPO | Attending: Obstetrics & Gynecology

## 2023-02-14 DIAGNOSIS — O9081 Anemia of the puerperium: Secondary | ICD-10-CM | POA: Diagnosis not present

## 2023-02-14 DIAGNOSIS — M62838 Other muscle spasm: Secondary | ICD-10-CM | POA: Diagnosis not present

## 2023-02-14 DIAGNOSIS — O99214 Obesity complicating childbirth: Secondary | ICD-10-CM | POA: Diagnosis present

## 2023-02-14 DIAGNOSIS — O24420 Gestational diabetes mellitus in childbirth, diet controlled: Secondary | ICD-10-CM | POA: Diagnosis not present

## 2023-02-14 DIAGNOSIS — Z3A38 38 weeks gestation of pregnancy: Secondary | ICD-10-CM | POA: Diagnosis not present

## 2023-02-14 DIAGNOSIS — O99820 Streptococcus B carrier state complicating pregnancy: Secondary | ICD-10-CM | POA: Diagnosis not present

## 2023-02-14 DIAGNOSIS — O99210 Obesity complicating pregnancy, unspecified trimester: Secondary | ICD-10-CM | POA: Diagnosis present

## 2023-02-14 DIAGNOSIS — O99824 Streptococcus B carrier state complicating childbirth: Secondary | ICD-10-CM | POA: Diagnosis present

## 2023-02-14 DIAGNOSIS — D62 Acute posthemorrhagic anemia: Secondary | ICD-10-CM | POA: Diagnosis not present

## 2023-02-14 DIAGNOSIS — O24419 Gestational diabetes mellitus in pregnancy, unspecified control: Secondary | ICD-10-CM | POA: Diagnosis present

## 2023-02-14 DIAGNOSIS — O10913 Unspecified pre-existing hypertension complicating pregnancy, third trimester: Principal | ICD-10-CM | POA: Diagnosis present

## 2023-02-14 DIAGNOSIS — O1092 Unspecified pre-existing hypertension complicating childbirth: Secondary | ICD-10-CM | POA: Diagnosis present

## 2023-02-14 DIAGNOSIS — O1002 Pre-existing essential hypertension complicating childbirth: Secondary | ICD-10-CM | POA: Diagnosis not present

## 2023-02-14 DIAGNOSIS — Z3490 Encounter for supervision of normal pregnancy, unspecified, unspecified trimester: Secondary | ICD-10-CM

## 2023-02-14 LAB — COMPREHENSIVE METABOLIC PANEL
ALT: 25 U/L (ref 0–44)
AST: 25 U/L (ref 15–41)
Albumin: 2.5 g/dL — ABNORMAL LOW (ref 3.5–5.0)
Alkaline Phosphatase: 113 U/L (ref 38–126)
Anion gap: 9 (ref 5–15)
BUN: 8 mg/dL (ref 6–20)
CO2: 20 mmol/L — ABNORMAL LOW (ref 22–32)
Calcium: 9 mg/dL (ref 8.9–10.3)
Chloride: 107 mmol/L (ref 98–111)
Creatinine, Ser: 0.61 mg/dL (ref 0.44–1.00)
GFR, Estimated: >60 mL/min (ref >60)
Glucose, Bld: 98 mg/dL (ref 70–99)
Potassium: 3.6 mmol/L (ref 3.5–5.1)
Sodium: 136 mmol/L (ref 135–145)
Total Bilirubin: 0.3 mg/dL (ref 0.3–1.2)
Total Protein: 6.5 g/dL (ref 6.5–8.1)

## 2023-02-14 LAB — CBC
HCT: 40 % (ref 36.0–46.0)
Hemoglobin: 12.9 g/dL (ref 12.0–15.0)
MCH: 26.7 pg (ref 26.0–34.0)
MCHC: 32.3 g/dL (ref 30.0–36.0)
MCV: 82.8 fL (ref 80.0–100.0)
Platelets: 206 10*3/uL (ref 150–400)
RBC: 4.83 MIL/uL (ref 3.87–5.11)
RDW: 15.2 % (ref 11.5–15.5)
WBC: 5.5 10*3/uL (ref 4.0–10.5)
nRBC: 0 % (ref 0.0–0.2)

## 2023-02-14 LAB — GLUCOSE, CAPILLARY
Glucose-Capillary: 92 mg/dL (ref 70–99)
Glucose-Capillary: 87 mg/dL (ref 70–99)
Glucose-Capillary: 81 mg/dL (ref 70–99)
Glucose-Capillary: 70 mg/dL (ref 70–99)
Glucose-Capillary: 92 mg/dL (ref 70–99)

## 2023-02-14 LAB — TYPE AND SCREEN
ABO/RH(D): O POS
Antibody Screen: NEGATIVE

## 2023-02-14 LAB — RPR: RPR Ser Ql: NONREACTIVE

## 2023-02-14 MED ORDER — SOD CITRATE-CITRIC ACID 500-334 MG/5ML PO SOLN
30.0000 mL | ORAL | Status: DC | PRN
  Administered 2023-02-16: 30 mL via ORAL
  Filled 2023-02-14: qty 30

## 2023-02-14 MED ORDER — LACTATED RINGERS IV SOLN: 500.0000 mL | INTRAVENOUS | Status: DC | PRN

## 2023-02-14 MED ORDER — PENICILLIN G POT IN DEXTROSE 60000 UNIT/ML IV SOLN
3.0000 10*6.[IU] | INTRAVENOUS | Status: DC
  Administered 2023-02-14 – 2023-02-16 (×13): 3 10*6.[IU] via INTRAVENOUS
  Filled 2023-02-14 (×13): qty 50

## 2023-02-14 MED ORDER — SODIUM CHLORIDE 0.9 % IV SOLN
5.0000 10*6.[IU] | Freq: Once | INTRAVENOUS | Status: AC
  Administered 2023-02-14: 5 10*6.[IU] via INTRAVENOUS
  Filled 2023-02-14: qty 5

## 2023-02-14 MED ORDER — ONDANSETRON HCL 4 MG/2ML IJ SOLN
4.0000 mg | Freq: Four times a day (QID) | INTRAMUSCULAR | Status: DC | PRN
  Administered 2023-02-14: 4 mg via INTRAVENOUS
  Filled 2023-02-14: qty 2

## 2023-02-14 MED ORDER — FENTANYL CITRATE (PF) 100 MCG/2ML IJ SOLN
50.0000 ug | INTRAMUSCULAR | Status: DC | PRN
  Administered 2023-02-14 (×3): 100 ug via INTRAVENOUS
  Filled 2023-02-14 (×3): qty 2

## 2023-02-14 MED ORDER — MISOPROSTOL 50MCG HALF TABLET
50.0000 ug | ORAL_TABLET | Freq: Once | ORAL | Status: AC
  Administered 2023-02-14: 50 ug via ORAL
  Filled 2023-02-14: qty 1

## 2023-02-14 MED ORDER — OXYTOCIN-SODIUM CHLORIDE 30-0.9 UT/500ML-% IV SOLN
1.0000 m[IU]/min | INTRAVENOUS | Status: DC
  Administered 2023-02-15: 2 m[IU]/min via INTRAVENOUS
  Filled 2023-02-14: qty 500

## 2023-02-14 MED ORDER — MISOPROSTOL 25 MCG QUARTER TABLET
25.0000 ug | ORAL_TABLET | Freq: Once | ORAL | Status: AC
  Administered 2023-02-14: 25 ug via VAGINAL
  Filled 2023-02-14: qty 1

## 2023-02-14 MED ORDER — OXYCODONE-ACETAMINOPHEN 5-325 MG PO TABS: 1.0000 | ORAL_TABLET | ORAL | Status: DC | PRN

## 2023-02-14 MED ORDER — TERBUTALINE SULFATE 1 MG/ML IJ SOLN: 0.2500 mg | Freq: Once | INTRAMUSCULAR | Status: DC | PRN

## 2023-02-14 MED ORDER — LIDOCAINE HCL (PF) 1 % IJ SOLN: 30.0000 mL | INTRAMUSCULAR | Status: DC | PRN

## 2023-02-14 MED ORDER — OXYTOCIN BOLUS FROM INFUSION: 333.0000 mL | Freq: Once | INTRAVENOUS | Status: DC

## 2023-02-14 MED ORDER — NIFEDIPINE ER OSMOTIC RELEASE 30 MG PO TB24
30.0000 mg | ORAL_TABLET | Freq: Every day | ORAL | Status: DC
  Administered 2023-02-14 – 2023-02-15 (×2): 30 mg via ORAL
  Filled 2023-02-14 (×2): qty 1

## 2023-02-14 MED ORDER — ACETAMINOPHEN 325 MG PO TABS: 650.0000 mg | ORAL_TABLET | ORAL | Status: DC | PRN

## 2023-02-14 MED ORDER — OXYTOCIN-SODIUM CHLORIDE 30-0.9 UT/500ML-% IV SOLN: 2.5000 [IU]/h | INTRAVENOUS | Status: DC

## 2023-02-14 MED ORDER — LACTATED RINGERS IV SOLN: INTRAVENOUS | Status: DC

## 2023-02-14 MED ORDER — FENTANYL CITRATE (PF) 100 MCG/2ML IJ SOLN: 100.0000 ug | INTRAMUSCULAR | Status: DC | PRN

## 2023-02-14 MED ORDER — MISOPROSTOL 50MCG HALF TABLET
50.0000 ug | ORAL_TABLET | ORAL | Status: DC
  Administered 2023-02-14 (×3): 50 ug via BUCCAL
  Filled 2023-02-14 (×3): qty 1

## 2023-02-14 NOTE — H&P (Signed)
HPI: Kristine Johnson is a 37 y.o. year old G12P0010 female at [redacted]w[redacted]d weeks gestation who presents to L&D for IOL for CHTN and A1GDM. Denies LOF or VB.   Last [place] 01/29/23: Est. FW: 2896 gm 6 lb 6 oz 59 %   Nursing Staff Provider  Office Location [facility] Dating  02/26/2023, by Last Menstrual Period  [facility] Model [x]  Traditional [ ]  Centering [ ]  Mom-Baby Dyad    Language  English Anatomy US  WNL  Flu Vaccine  03/28/2022 Genetic/Carrier Screen  NIPS:  low risk female  AFP:   normal Horizon: silent carrier alpha thal  TDaP Vaccine  11/26/22 Hgb A1C or  GTT Early 5.8 Third trimester   COVID Vaccine 2 doses/Pfizer   LAB RESULTS   Rhogam  O/Positive/-- (02/22 1109)  Blood Type O/Positive/-- (02/22 1109)   Baby Feeding Plan Breast Antibody Negative (02/22 1109)  Contraception Undecided Rubella 2.01 (02/22 1109)  Circumcision Yes RPR Non Reactive (02/22 1109)   Pediatrician  [facility] HBsAg Negative (02/22 1109)   Support Person TAMESHA(Sister) HCVAb Non Reactive (02/22 1109)   Prenatal Classes  HIV Non Reactive (02/22 1109)     BTL Consent na GBS    positive (For PCN allergy, check sensitivities)   VBAC Consent na Pap Diagnosis  Date Value Ref Range Status  11/13/2021 - Low grade squamous intraepithelial lesion (LSIL) (A)  Final         DME Rx [x]  BP cuff [ ]  Weight Scale Waterbirth  [ ]  Class [ ]  Consent [ ]  CNM visit  PHQ9 & GAD7 [  ] new OB [  ] 28 weeks  [  ] 36 weeks Induction  [ ]  Orders Entered [ ] Foley Y/N     OB History     Gravida  2   Para      Term      Preterm      AB  1   Living         SAB  1   IAB      Ectopic      Multiple      Live Births             Past Medical History:  Diagnosis Date   Asthma    Dysmenorrhea 11/04/2020   Fibroid    Gestational diabetes    Hypertension    Migraine    Prediabetes 08/14/2022   Needs early 2 hour   Vaginal Pap smear, abnormal    Past Surgical History:  Procedure Laterality  Date   HERNIA REPAIR  12/1996   TONSILLECTOMY  2014   WISDOM TOOTH EXTRACTION     Family History: family history includes Diabetes in her father. Social History:  reports that she has never smoked. She has never used smokeless tobacco. She reports that she does not currently use alcohol. She reports that she does not currently use drugs.     Maternal Diabetes: Yes:  Diabetes Type:  Diet controlled Genetic Screening: Normal Maternal Ultrasounds/Referrals: Normal Fetal Ultrasounds or other Referrals:  Referred to Materal Fetal Medicine  Maternal Substance Abuse:  No Significant Maternal Medications:  Meds include: Other: procardia Significant Maternal Lab Results:  Group B Strep positive Number of Prenatal Visits:greater than 3 verified prenatal visits Other Comments:   silent carrier alpha thal  Review of Systems  Constitutional:  Negative for chills and fever.  Eyes:  Negative for visual disturbance.  Gastrointestinal:  Negative for abdominal pain.  Genitourinary:  Negative for vaginal bleeding and vaginal discharge.  Neurological:  Negative for headaches.   History Dilation: 1 Effacement (%): Thick Station: -2 Exam by:: Danford Bad, RN  Blood pressure (!) 142/78, pulse 79, temperature 98.2 F (36.8 C), temperature source Oral, resp. rate 20, height 5\' 2"  (1.575 m), weight 112.7 kg, last menstrual period 05/22/2022. Maternal Exam:  Uterine Assessment: Contraction strength is mild.  Contraction frequency is irregular.  Abdomen: Patient reports no abdominal tenderness. Estimated fetal weight is 7lb 8oz.   Fetal presentation: vertex Introitus: Normal vulva. Pelvis: adequate for delivery.   Cervix: Cervix evaluated by digital exam.     Fetal Exam Fetal Monitor Review: Baseline rate: 120.  Variability: moderate (6-25 bpm).   Pattern: accelerations present and no decelerations.   Fetal State Assessment: Category I - tracings are normal.   Physical  Exam Constitutional:      General: She is not in acute distress.    Appearance: She is obese. She is not toxic-appearing.  Eyes:     Conjunctiva/sclera: Conjunctivae normal.  Cardiovascular:     Rate and Rhythm: Normal rate.  Pulmonary:     Effort: Pulmonary effort is normal. No respiratory distress.  Abdominal:     Palpations: Abdomen is soft.     Tenderness: There is no abdominal tenderness.  Genitourinary:    General: Normal vulva.  Musculoskeletal:     Right lower leg: Edema present.     Left lower leg: Edema present.  Skin:    General: Skin is warm and dry.  Neurological:     General: No focal deficit present.     Mental Status: She is alert.     Deep Tendon Reflexes: Reflexes normal.  Psychiatric:        Mood and Affect: Mood normal.     Prenatal labs: ABO, Rh: --/--/O POS (08/25 0420) Antibody: NEG (08/25 0420) Rubella: 2.01 (02/22 1109) RPR: NON REACTIVE (08/25 0420)  HBsAg: Negative (02/22 1109)  HIV: Non Reactive (06/24 1151)  GBS: Positive/-- (08/08 0815)   Assessment: 1. Labor: Early/IOL. Cytotec then AROM, Pit PRN 2. Fetal Wellbeing: Category I  3. Pain Control: Planning epidural 4. GBS: pos 5. 38.2 week IUP 6. A1GDM 7. CHTN  Plan:  1. Admit to BS per consult with MD 2. Routine L&D orders 3. Analgesia/anesthesia PRN  4. CBGs Q4 5. Procardia 30 XL 6. Pre-E labs .   Dorathy Kinsman 02/14/2023, 9:30 PM

## 2023-02-14 NOTE — Progress Notes (Signed)
Pt walking in room.

## 2023-02-14 NOTE — Progress Notes (Signed)
Pt on birthing ball

## 2023-02-14 NOTE — Progress Notes (Signed)
LABOR PROGRESS NOTE  Mckala Ermina Azzolina is a 37 y.o. G2P0010 at [redacted]w[redacted]d presented for IOL for cHTN, A1GDM.  S: Painful ctx.  O:  BP (!) 143/83   Pulse 77   Temp 98.2 F (36.8 C) (Oral)   Resp 19   Ht 5\' 2"  (1.575 m)   Wt 112.7 kg   LMP 05/22/2022   BMI 45.45 kg/m  EFM:125 bpm/Moderate variability/ 15x15 accels/ None decels CAT: 1 Toco: irregular, every 2-5 minutes   CVE: Dilation: 1 Effacement (%): Thick Station: -2 Exam by:: Ivonne Andrew CNM   [name]: 37 y.o. G2P0010 [redacted]w[redacted]d here for IOL as above  #Labor: Continue with cervical ripening. Cytotec x3, foley bulb in place #Pain: Per pt. Contemplating epidural soon #FWB: CAT 1 #GBS positive - PCN  #cHTN: continue home procardia 30 mg #A1GDM: diet-controlled. Babe 59%th  Joanne Gavel, MD FMOB Fellow, Faculty practice [facility], Center for [facility] Healthcare 02/14/23  8:59 PM

## 2023-02-14 NOTE — Progress Notes (Signed)
Kristine Johnson is a 36 y.o. G2P0010 at [redacted]w[redacted]d.  Subjective: Mod pain w/ contractions.   Objective: BP (!) 142/78   Pulse 79   Temp 98.2 F (36.8 C) (Oral)   Resp 20   Ht 5\' 2"  (1.575 m)   Wt 112.7 kg   LMP 05/22/2022   BMI 45.45 kg/m    FHT:  FHR: 135 bpm, variability: mod,  accelerations:  15x15,  decelerations:  none UC:   Q 1-4 minutes, mod Dilation: 1 Effacement (%): Thick Station: -2 Exam by:: V Kidus Delman CNM Foley placed w/out difficulty   Labs: Results for orders placed or performed during the hospital encounter of 02/14/23 (from the past 24 hour(s))  CBC     Status: None   Collection Time: 02/14/23  4:20 AM  Result Value Ref Range   WBC 5.5 4.0 - 10.5 K/uL   RBC 4.83 3.87 - 5.11 MIL/uL   Hemoglobin 12.9 12.0 - 15.0 g/dL   HCT 16.1 09.6 - 04.5 %   MCV 82.8 80.0 - 100.0 fL   MCH 26.7 26.0 - 34.0 pg   MCHC 32.3 30.0 - 36.0 g/dL   RDW 40.9 81.1 - 91.4 %   Platelets 206 150 - 400 K/uL   nRBC 0.0 0.0 - 0.2 %  Type and screen     Status: None   Collection Time: 02/14/23  4:20 AM  Result Value Ref Range   ABO/RH(D) O POS    Antibody Screen NEG    Sample Expiration      02/17/2023,2359 Performed at [facility], [address] [place], [place] [address]   RPR     Status: None   Collection Time: 02/14/23  4:20 AM  Result Value Ref Range   RPR Ser Ql NON REACTIVE NON REACTIVE  Comprehensive metabolic panel     Status: Abnormal   Collection Time: 02/14/23  4:20 AM  Result Value Ref Range   Sodium 136 135 - 145 mmol/L   Potassium 3.6 3.5 - 5.1 mmol/L   Chloride 107 98 - 111 mmol/L   CO2 20 (L) 22 - 32 mmol/L   Glucose, Bld 98 70 - 99 mg/dL   BUN 8 6 - 20 mg/dL   Creatinine, Ser 6.21 0.44 - 1.00 mg/dL   Calcium 9.0 8.9 - 30.8 mg/dL   Total Protein 6.5 6.5 - 8.1 g/dL   Albumin 2.5 (L) 3.5 - 5.0 g/dL   AST 25 15 - 41 U/L   ALT 25 0 - 44 U/L   Alkaline Phosphatase 113 38 - 126 U/L   Total Bilirubin 0.3 0.3 - 1.2 mg/dL   GFR, Estimated >65 >78  mL/min   Anion gap 9 5 - 15  Glucose, capillary     Status: None   Collection Time: 02/14/23  4:49 AM  Result Value Ref Range   Glucose-Capillary 92 70 - 99 mg/dL  Glucose, capillary     Status: None   Collection Time: 02/14/23  8:58 AM  Result Value Ref Range   Glucose-Capillary 87 70 - 99 mg/dL  Glucose, capillary     Status: None   Collection Time: 02/14/23 12:31 PM  Result Value Ref Range   Glucose-Capillary 81 70 - 99 mg/dL  Glucose, capillary     Status: None   Collection Time: 02/14/23  4:02 PM  Result Value Ref Range   Glucose-Capillary 70 70 - 99 mg/dL    Assessment / Plan: [redacted]w[redacted]d week IUP Labor: IOL/Early, Cytotec x  3 Fetal Wellbeing:  Category I Pain Control:  Nitrous, Fenanyl Anticipated MOD:  SVD  Dorathy Kinsman, CNM 02/14/2023 7:47 PM

## 2023-02-15 ENCOUNTER — Encounter: Payer: BC Managed Care – PPO | Admitting: Obstetrics and Gynecology

## 2023-02-15 ENCOUNTER — Inpatient Hospital Stay (HOSPITAL_COMMUNITY): Payer: BC Managed Care – PPO | Admitting: Anesthesiology

## 2023-02-15 LAB — GLUCOSE, CAPILLARY
Glucose-Capillary: 103 mg/dL — ABNORMAL HIGH (ref 70–99)
Glucose-Capillary: 104 mg/dL — ABNORMAL HIGH (ref 70–99)
Glucose-Capillary: 106 mg/dL — ABNORMAL HIGH (ref 70–99)
Glucose-Capillary: 112 mg/dL — ABNORMAL HIGH (ref 70–99)
Glucose-Capillary: 162 mg/dL — ABNORMAL HIGH (ref 70–99)
Glucose-Capillary: 180 mg/dL — ABNORMAL HIGH (ref 70–99)
Glucose-Capillary: 82 mg/dL (ref 70–99)
Glucose-Capillary: 84 mg/dL (ref 70–99)

## 2023-02-15 MED ORDER — PHENYLEPHRINE 80 MCG/ML (10ML) SYRINGE FOR IV PUSH (FOR BLOOD PRESSURE SUPPORT): 80.0000 ug | PREFILLED_SYRINGE | INTRAVENOUS | Status: DC | PRN

## 2023-02-15 MED ORDER — CYCLOBENZAPRINE HCL 5 MG PO TABS
5.0000 mg | ORAL_TABLET | Freq: Once | ORAL | Status: AC
  Administered 2023-02-15: 5 mg via ORAL
  Filled 2023-02-15: qty 1

## 2023-02-15 MED ORDER — LIDOCAINE HCL (PF) 1 % IJ SOLN
INTRAMUSCULAR | Status: DC | PRN
  Administered 2023-02-15 (×2): 4 mL via EPIDURAL

## 2023-02-15 MED ORDER — EPHEDRINE 5 MG/ML INJ: 10.0000 mg | INTRAVENOUS | Status: DC | PRN

## 2023-02-15 MED ORDER — DIPHENHYDRAMINE HCL 50 MG/ML IJ SOLN: 12.5000 mg | INTRAMUSCULAR | Status: DC | PRN

## 2023-02-15 MED ORDER — FENTANYL-BUPIVACAINE-NACL 0.5-0.125-0.9 MG/250ML-% EP SOLN
12.0000 mL/h | EPIDURAL | Status: DC | PRN
  Administered 2023-02-15 – 2023-02-16 (×3): 12 mL/h via EPIDURAL
  Filled 2023-02-15 (×3): qty 250

## 2023-02-15 MED ORDER — LACTATED RINGERS IV SOLN: 500.0000 mL | Freq: Once | INTRAVENOUS | Status: DC

## 2023-02-15 MED ORDER — LIDOCAINE 5 % EX PTCH
1.0000 | MEDICATED_PATCH | CUTANEOUS | Status: DC
  Administered 2023-02-15: 1 via TRANSDERMAL
  Filled 2023-02-15 (×3): qty 1

## 2023-02-15 MED ORDER — METOCLOPRAMIDE HCL 5 MG/ML IJ SOLN: 10.0000 mg | Freq: Four times a day (QID) | INTRAMUSCULAR | Status: DC | PRN

## 2023-02-15 NOTE — Progress Notes (Signed)
LABOR PROGRESS NOTE  Kristine Johnson is a 37 y.o. G2P0010 at [redacted]w[redacted]d presented for IOL for cHTN, A1GDM  S: Comfortable s/p epidural. Some neck muscle spasms.  O:  BP 137/82   Pulse 94   Temp 98 F (36.7 C) (Oral)   Resp 18   Ht 5\' 2"  (1.575 m)   Wt 112.7 kg   LMP 05/22/2022   SpO2 100%   BMI 45.45 kg/m  EFM:130 bpm/Moderate variability/ 15x15 accels/ None decels CAT: 1 Toco: regular 1-2 minutes   CVE: Dilation: 5 Effacement (%): 80 Station: -2 Exam by:: Dr. Earlene Plater   [name]: 37 y.o. G2P0010 [redacted]w[redacted]d here for IOL as above  #Labor: Progressing well. AROM performed after obtaining verbal consent from pt after discussion of risks/benefits. Small amount of clear fluid. Continue to titrate pitocin #Pain: Epidural #FWB: CAT 1 #GBS positive - PCN  #cHTN: continue home procardia 30 mg #A1GDM: diet-controlled. Babe 59%th  Joanne Gavel, MD FMOB Fellow, Faculty practice [facility], Center for [facility] Healthcare 02/15/23  6:18 AM

## 2023-02-15 NOTE — Progress Notes (Signed)
LABOR PROGRESS NOTE  Kristine Johnson is a 37 y.o. G2P0010 at [redacted]w[redacted]d presented for IOL for cHTN and A1GDM  S: At bedside to check on patient, she is resting and feeling comfortable. She remains in good spirits.  O:  BP 132/79   Pulse 84   Temp 98.6 F (37 C) (Tympanic)   Resp 18   Ht 5\' 2"  (1.575 m)   Wt 112.7 kg   LMP 05/22/2022   SpO2 100%   BMI 45.45 kg/m  EFM:124 bpm/Moderate variability/ 15x15 accels/ intermittent late decel that resolves with position changes Toco: regular 1-2 minutes, MVU 170-180   CVE: Dilation: 4.5 Effacement (%): 60 Station: -3 Presentation: Vertex Exam by:: Brynda Greathouse, RN   [name]: 37 y.o. G2P0010 [redacted]w[redacted]d here for IOL as above  #Labor: s/p cytotec x3, FB placement, AROM 0549. SVE per RN unchanged from prior. IUPC in place. MVU not adequate, continue to titrate pitocin. #Pain: Epidural #FWB: overall reassuring with moderate variability and accels, intermittent late decel that resolved with position changes, continue to monitor closely.  #GBS positive - PCN  #cHTN: continue home procardia 30 mg, normotensive to mild range BP, admission pre-E labs negative.  #A1GDM: diet-controlled. Continue q4hr BG while in latent labor. EFW 59th%ile at 36 WGA.  Billey Co, MD FMOB , Faculty practice [facility], Center for [facility] Healthcare 02/15/23  6:37 PM

## 2023-02-15 NOTE — Progress Notes (Signed)
LABOR PROGRESS NOTE  Kristine Johnson is a 37 y.o. G2P0010 at [redacted]w[redacted]d presented for IOL for cHTN, A1GDM  S: Comfortable s/p epidural. Feeling nauseous  O:  BP 133/85   Pulse 80   Temp 98.2 F (36.8 C) (Oral)   Resp 17   Ht 5\' 2"  (1.575 m)   Wt 112.7 kg   LMP 05/22/2022   SpO2 100%   BMI 45.45 kg/m  EFM:120 bpm/Moderate variability/ 15x15 accels/ None decels CAT: 1 Toco: irregular, every 2-5 minutes   CVE: Dilation: 4 Effacement (%): 70 Station: -3 Exam by:: Dr. Earlene Plater   [name]: 37 y.o. G2P0010 [redacted]w[redacted]d here for IOL as above  #Labor: Progressing well. S/p FB. Start pit and plan for AROM #Pain: Epidural #FWB: CAT 1 #GBS positive - PCN  #cHTN: continue home procardia 30 mg #A1GDM: diet-controlled. Babe 59%th  Joanne Gavel, MD FMOB Fellow, Faculty practice [facility], Center for [facility] Healthcare 02/15/23  3:00 AM

## 2023-02-15 NOTE — Progress Notes (Addendum)
LABOR PROGRESS NOTE  Kristine Johnson is a 37 y.o. G2P0010 at [redacted]w[redacted]d presented for IOL for cHTN and A1GDM  S: At bedside to repeat SVE, pt taking a nap.  O:  BP (!) 113/59   Pulse 87   Temp 98.7 F (37.1 C) (Oral)   Resp 18   Ht 5\' 2"  (1.575 m)   Wt 112.7 kg   LMP 05/22/2022   SpO2 100%   BMI 45.45 kg/m  EFM:135 bpm/Moderate variability/ 15x15 accels/ intermittent variable decels that resolved with position changes CAT: 2, overall reaussring Toco: regular 1-2 minutes   CVE: Dilation: 5 Effacement (%): 80 Station: -3 Presentation: Vertex Exam by:: Ned Grace, RN   [name]: 37 y.o. G2P0010 [redacted]w[redacted]d here for IOL as above  #Labor: s/p cytotec x3, FB placement, AROM 0549. SVE unchanged from prior, consider IUPC placement at next check. #Pain: Epidural #FWB: cat 2, overall reassuring with moderate variability and accels, late decels now resolved  #GBS positive - PCN  #cHTN: continue home procardia 30 mg, normotensive to mild range BP, admission pre-E labs negative.  #A1GDM: diet-controlled. Continue q4hr BG while in latent labor. EFW 59th%ile at 36 WGA.  Billey Co, MD FMOB Fellow, Faculty practice [facility], Center for [facility] Healthcare 02/15/23  10:51 AM

## 2023-02-15 NOTE — Anesthesia Preprocedure Evaluation (Signed)
Anesthesia Evaluation  Patient identified by MRN, date of birth, ID band Patient awake    Reviewed: Allergy & Precautions, Patient's Chart, lab work & pertinent test results  History of Anesthesia Complications Negative for: history of anesthetic complications  Airway Mallampati: II  TM Distance: >3 FB Neck ROM: Full    Dental no notable dental hx.    Pulmonary asthma    Pulmonary exam normal        Cardiovascular hypertension, Normal cardiovascular exam     Neuro/Psych  Headaches    GI/Hepatic negative GI ROS, Neg liver ROS,,,  Endo/Other  diabetes, Gestational  Morbid obesity  Renal/GU negative Renal ROS  negative genitourinary   Musculoskeletal negative musculoskeletal ROS (+)    Abdominal   Peds  Hematology negative hematology ROS (+)   Anesthesia Other Findings Day of surgery medications reviewed with patient.  Reproductive/Obstetrics (+) Pregnancy                             Anesthesia Physical Anesthesia Plan  ASA: 3  Anesthesia Plan: Epidural   Post-op Pain Management:    Induction:   PONV Risk Score and Plan: Treatment may vary due to age or medical condition  Airway Management Planned: Natural Airway  Additional Equipment: Fetal Monitoring  Intra-op Plan:   Post-operative Plan:   Informed Consent: I have reviewed the patients History and Physical, chart, labs and discussed the procedure including the risks, benefits and alternatives for the proposed anesthesia with the patient or authorized representative who has indicated his/her understanding and acceptance.       Plan Discussed with:   Anesthesia Plan Comments:        Anesthesia Quick Evaluation

## 2023-02-15 NOTE — Progress Notes (Signed)
LABOR PROGRESS NOTE  Kristine Johnson is a 37 y.o. G2P0010 at [redacted]w[redacted]d presented for IOL for cHTN, A1GDM.  S: Comfortable. Feeling more pelvic pressure.  O:  BP 125/75   Pulse 84   Temp 98.1 F (36.7 C) (Axillary)   Resp 18   Ht 5\' 2"  (1.575 m)   Wt 112.7 kg   LMP 05/22/2022   SpO2 100%   BMI 45.45 kg/m  EFM:140 bpm/Moderate variability/ 15x15 accels/ None decels CAT: 1 Toco: regular, every 2 minutes   CVE: Dilation: 6 Effacement (%): 60 Cervical Position: Middle Station: -2 Presentation: Vertex Exam by:: Brynda Greathouse, RN   [name]: 37 y.o. G2P0010 [redacted]w[redacted]d here for IOL as above  #Labor: Small amount of cervical change. IUPC in place. Between ~180-200 MVUs. Continue to titrate pitocin #Pain: Epidural #FWB: CAT 1 #GBS positive - PCN  #cHTN: continue home procardia 30 mg #A1GDM: diet-controlled. Babe 59%th  Joanne Gavel, MD FMOB Fellow, Faculty practice [facility], Center for [facility] Healthcare 02/15/23  9:01 PM

## 2023-02-15 NOTE — Anesthesia Procedure Notes (Signed)
Epidural Patient location during procedure: OB Start time: 02/15/2023 12:33 AM End time: 02/15/2023 12:36 AM  Staffing Anesthesiologist: Kaylyn Layer, MD Performed: anesthesiologist   Preanesthetic Checklist Completed: patient identified, IV checked, risks and benefits discussed, monitors and equipment checked, pre-op evaluation and timeout performed  Epidural Patient position: sitting Prep: DuraPrep and site prepped and draped Patient monitoring: continuous pulse ox, blood pressure and heart rate Approach: midline Location: L3-L4 Injection technique: LOR air  Needle:  Needle type: Tuohy  Needle gauge: 17 G Needle length: 9 cm Needle insertion depth: 5 cm Catheter type: closed end flexible Catheter size: 19 Gauge Catheter at skin depth: 10 cm Test dose: negative and Other (1% lidocaine)  Assessment Events: blood not aspirated, no cerebrospinal fluid, injection not painful, no injection resistance, no paresthesia and negative IV test  Additional Notes Patient identified. Risks, benefits, and alternatives discussed with patient including but not limited to bleeding, infection, nerve damage, paralysis, failed block, incomplete pain control, headache, blood pressure changes, nausea, vomiting, reactions to medication, itching, and postpartum back pain. Confirmed with bedside nurse the patient's most recent platelet count. Confirmed with patient that they are not currently taking any anticoagulation, have any bleeding history, or any family history of bleeding disorders. Patient expressed understanding and wished to proceed. All questions were answered. Sterile technique was used throughout the entire procedure. Please see nursing notes for vital signs.   Crisp LOR on first pass. Test dose was given through epidural catheter and negative prior to continuing to dose epidural or start infusion. Warning signs of high block given to the patient including shortness of breath,  tingling/numbness in hands, complete motor block, or any concerning symptoms with instructions to call for help. Patient was given instructions on fall risk and not to get out of bed. All questions and concerns addressed with instructions to call with any issues or inadequate analgesia.  Reason for block:procedure for pain

## 2023-02-15 NOTE — Progress Notes (Addendum)
LABOR PROGRESS NOTE  Kristine Johnson is a 37 y.o. G2P0010 at [redacted]w[redacted]d presented for IOL for cHTN and A1GDM  S: At bedside to repeat SVE, discussed IUPC placement if unchanged and pt is agreeable.  O:  BP 122/73   Pulse 98   Temp 98.9 F (37.2 C) (Oral)   Resp 18   Ht 5\' 2"  (1.575 m)   Wt 112.7 kg   LMP 05/22/2022   SpO2 100%   BMI 45.45 kg/m  EFM:130 bpm/Moderate variability/ 15x15 accels/ no decels CAT: 1 Toco: regular 1-2 minutes   CVE: Dilation: 4.5 Effacement (%): 60 Station: -3 Presentation: Vertex Exam by:: Dr. Miquel Dunn   [name]: 37 y.o. G2P0010 [redacted]w[redacted]d here for IOL as above  #Labor: s/p cytotec x3, FB placement, AROM 0549. SVE unchanged from prior, IUPC placed without difficulty. Continue to titrate pitocin and monitor MVUs with IUPC #Pain: Epidural #FWB: cat 1, reassuring #GBS positive - PCN  #cHTN: continue home procardia 30 mg, normotensive to mild range BP, admission pre-E labs negative.  #A1GDM: diet-controlled. Continue q4hr BG while in latent labor. EFW 59th%ile at 36 WGA.  Billey Co, MD FMOB , Faculty practice [facility], Center for [facility] Healthcare 02/15/23  4:12 PM

## 2023-02-16 ENCOUNTER — Encounter (HOSPITAL_COMMUNITY)
Admission: AD | Disposition: A | Discharge status: Home / Self Care | Payer: Self-pay | Source: Home / Self Care | Attending: Family Medicine

## 2023-02-16 ENCOUNTER — Other Ambulatory Visit: Payer: Self-pay

## 2023-02-16 ENCOUNTER — Encounter (HOSPITAL_COMMUNITY): Payer: Self-pay | Admitting: Obstetrics and Gynecology

## 2023-02-16 DIAGNOSIS — O99214 Obesity complicating childbirth: Secondary | ICD-10-CM

## 2023-02-16 DIAGNOSIS — Z3A38 38 weeks gestation of pregnancy: Secondary | ICD-10-CM

## 2023-02-16 DIAGNOSIS — O99820 Streptococcus B carrier state complicating pregnancy: Secondary | ICD-10-CM

## 2023-02-16 DIAGNOSIS — O24420 Gestational diabetes mellitus in childbirth, diet controlled: Secondary | ICD-10-CM | POA: Diagnosis not present

## 2023-02-16 DIAGNOSIS — O1002 Pre-existing essential hypertension complicating childbirth: Secondary | ICD-10-CM

## 2023-02-16 LAB — CREATININE, SERUM
Creatinine, Ser: 0.77 mg/dL (ref 0.44–1.00)
GFR, Estimated: >60 mL/min (ref >60)

## 2023-02-16 LAB — GLUCOSE, CAPILLARY
Glucose-Capillary: 102 mg/dL — ABNORMAL HIGH (ref 70–99)
Glucose-Capillary: 105 mg/dL — ABNORMAL HIGH (ref 70–99)
Glucose-Capillary: 107 mg/dL — ABNORMAL HIGH (ref 70–99)
Glucose-Capillary: 91 mg/dL (ref 70–99)
Glucose-Capillary: 93 mg/dL (ref 70–99)

## 2023-02-16 SURGERY — Surgical Case
Anesthesia: Epidural

## 2023-02-16 MED ORDER — DIPHENHYDRAMINE HCL 25 MG PO CAPS
25.0000 mg | ORAL_CAPSULE | Freq: Four times a day (QID) | ORAL | Status: DC | PRN
  Administered 2023-02-16 – 2023-02-17 (×2): 25 mg via ORAL
  Filled 2023-02-16 (×2): qty 1

## 2023-02-16 MED ORDER — DROPERIDOL 2.5 MG/ML IJ SOLN: 0.6250 mg | Freq: Once | INTRAMUSCULAR | Status: DC | PRN

## 2023-02-16 MED ORDER — FENTANYL CITRATE (PF) 100 MCG/2ML IJ SOLN
INTRAMUSCULAR | Status: DC | PRN
  Administered 2023-02-16: 100 ug via EPIDURAL

## 2023-02-16 MED ORDER — ACETAMINOPHEN 160 MG/5ML PO SOLN: 1000.0000 mg | Freq: Once | ORAL | Status: DC

## 2023-02-16 MED ORDER — LIDOCAINE HCL (PF) 1 % IJ SOLN
INTRAMUSCULAR | Status: AC
  Filled 2023-02-16: qty 5

## 2023-02-16 MED ORDER — MORPHINE SULFATE (PF) 0.5 MG/ML IJ SOLN
INTRAMUSCULAR | Status: AC
  Filled 2023-02-16: qty 10

## 2023-02-16 MED ORDER — CEFAZOLIN SODIUM-DEXTROSE 2-3 GM-%(50ML) IV SOLR
INTRAVENOUS | Status: DC | PRN
Start: 2023-02-16 — End: 2023-02-16
  Administered 2023-02-16: 2 g via INTRAVENOUS

## 2023-02-16 MED ORDER — KETOROLAC TROMETHAMINE 30 MG/ML IJ SOLN
30.0000 mg | Freq: Once | INTRAMUSCULAR | Status: AC
  Administered 2023-02-16: 30 mg via INTRAVENOUS

## 2023-02-16 MED ORDER — GABAPENTIN 100 MG PO CAPS
200.0000 mg | ORAL_CAPSULE | Freq: Every day | ORAL | Status: DC
  Administered 2023-02-16 – 2023-02-19 (×3): 200 mg via ORAL
  Filled 2023-02-16 (×3): qty 2

## 2023-02-16 MED ORDER — MEDROXYPROGESTERONE ACETATE 150 MG/ML IM SUSP: 150.0000 mg | INTRAMUSCULAR | Status: DC | PRN

## 2023-02-16 MED ORDER — MEASLES, MUMPS & RUBELLA VAC IJ SOLR: 0.5000 mL | Freq: Once | INTRAMUSCULAR | Status: DC

## 2023-02-16 MED ORDER — KETOROLAC TROMETHAMINE 30 MG/ML IJ SOLN: 30.0000 mg | Freq: Four times a day (QID) | INTRAMUSCULAR | Status: DC | PRN

## 2023-02-16 MED ORDER — SIMETHICONE 80 MG PO CHEW
80.0000 mg | CHEWABLE_TABLET | Freq: Three times a day (TID) | ORAL | Status: DC
  Administered 2023-02-17 – 2023-02-19 (×6): 80 mg via ORAL
  Filled 2023-02-16 (×6): qty 1

## 2023-02-16 MED ORDER — ENOXAPARIN SODIUM 60 MG/0.6ML IJ SOSY
60.0000 mg | PREFILLED_SYRINGE | INTRAMUSCULAR | Status: DC
  Administered 2023-02-17 – 2023-02-19 (×3): 60 mg via SUBCUTANEOUS
  Filled 2023-02-16 (×3): qty 0.6

## 2023-02-16 MED ORDER — DIPHENHYDRAMINE HCL 50 MG/ML IJ SOLN: 12.5000 mg | INTRAMUSCULAR | Status: DC | PRN

## 2023-02-16 MED ORDER — IBUPROFEN 600 MG PO TABS
600.0000 mg | ORAL_TABLET | Freq: Four times a day (QID) | ORAL | Status: DC
  Administered 2023-02-18 – 2023-02-19 (×5): 600 mg via ORAL
  Filled 2023-02-16 (×6): qty 1

## 2023-02-16 MED ORDER — ACETAMINOPHEN 500 MG PO TABS: 1000.0000 mg | ORAL_TABLET | Freq: Once | ORAL | Status: DC

## 2023-02-16 MED ORDER — SIMETHICONE 80 MG PO CHEW: 80.0000 mg | CHEWABLE_TABLET | ORAL | Status: DC | PRN

## 2023-02-16 MED ORDER — OXYTOCIN-SODIUM CHLORIDE 30-0.9 UT/500ML-% IV SOLN: 2.5000 [IU]/h | INTRAVENOUS | Status: AC

## 2023-02-16 MED ORDER — TETANUS-DIPHTH-ACELL PERTUSSIS 5-2.5-18.5 LF-MCG/0.5 IM SUSY: 0.5000 mL | PREFILLED_SYRINGE | Freq: Once | INTRAMUSCULAR | Status: DC

## 2023-02-16 MED ORDER — ONDANSETRON HCL 4 MG/2ML IJ SOLN: 4.0000 mg | Freq: Three times a day (TID) | INTRAMUSCULAR | Status: DC | PRN

## 2023-02-16 MED ORDER — MENTHOL 3 MG MT LOZG: 1.0000 | LOZENGE | OROMUCOSAL | Status: DC | PRN

## 2023-02-16 MED ORDER — TRANEXAMIC ACID-NACL 1000-0.7 MG/100ML-% IV SOLN
INTRAVENOUS | Status: DC | PRN
Start: 2023-02-16 — End: 2023-02-16
  Administered 2023-02-16: 1000 mg via INTRAVENOUS

## 2023-02-16 MED ORDER — PRENATAL MULTIVITAMIN CH
1.0000 | ORAL_TABLET | Freq: Every day | ORAL | Status: DC
  Administered 2023-02-17 – 2023-02-18 (×2): 1 via ORAL
  Filled 2023-02-16 (×2): qty 1

## 2023-02-16 MED ORDER — ACETAMINOPHEN 500 MG PO TABS: 1000.0000 mg | ORAL_TABLET | Freq: Four times a day (QID) | ORAL | Status: DC

## 2023-02-16 MED ORDER — ACETAMINOPHEN 10 MG/ML IV SOLN
INTRAVENOUS | Status: DC | PRN
  Administered 2023-02-16: 1000 mg via INTRAVENOUS

## 2023-02-16 MED ORDER — OXYCODONE HCL 5 MG PO TABS
5.0000 mg | ORAL_TABLET | ORAL | Status: DC | PRN
  Administered 2023-02-17 – 2023-02-18 (×2): 5 mg via ORAL
  Administered 2023-02-18 (×3): 10 mg via ORAL
  Administered 2023-02-19: 5 mg via ORAL
  Filled 2023-02-16 (×2): qty 2
  Filled 2023-02-16: qty 1
  Filled 2023-02-16: qty 2
  Filled 2023-02-16 (×2): qty 1

## 2023-02-16 MED ORDER — LIDOCAINE-EPINEPHRINE (PF) 2 %-1:200000 IJ SOLN
INTRAMUSCULAR | Status: AC
  Filled 2023-02-16: qty 20

## 2023-02-16 MED ORDER — NALOXONE HCL 4 MG/10ML IJ SOLN: 1.0000 ug/kg/h | INTRAVENOUS | Status: DC | PRN

## 2023-02-16 MED ORDER — ONDANSETRON HCL 4 MG/2ML IJ SOLN
INTRAMUSCULAR | Status: DC | PRN
  Administered 2023-02-16: 4 mg via INTRAVENOUS

## 2023-02-16 MED ORDER — MAGNESIUM HYDROXIDE 400 MG/5ML PO SUSP: 30.0000 mL | ORAL | Status: DC | PRN

## 2023-02-16 MED ORDER — SENNOSIDES-DOCUSATE SODIUM 8.6-50 MG PO TABS
2.0000 | ORAL_TABLET | Freq: Every day | ORAL | Status: DC
  Administered 2023-02-17 – 2023-02-19 (×3): 2 via ORAL
  Filled 2023-02-16 (×3): qty 2

## 2023-02-16 MED ORDER — LACTATED RINGERS IV SOLN: INTRAVENOUS | Status: DC

## 2023-02-16 MED ORDER — CALCIUM CARBONATE ANTACID 500 MG PO CHEW
400.0000 mg | CHEWABLE_TABLET | Freq: Once | ORAL | Status: AC
  Administered 2023-02-16: 400 mg via ORAL
  Filled 2023-02-16: qty 2

## 2023-02-16 MED ORDER — SODIUM CHLORIDE 0.9% FLUSH: 3.0000 mL | INTRAVENOUS | Status: DC | PRN

## 2023-02-16 MED ORDER — ONDANSETRON HCL 4 MG/2ML IJ SOLN
INTRAMUSCULAR | Status: AC
  Filled 2023-02-16: qty 2

## 2023-02-16 MED ORDER — FUROSEMIDE 20 MG PO TABS
20.0000 mg | ORAL_TABLET | Freq: Every day | ORAL | Status: DC
  Administered 2023-02-17 – 2023-02-19 (×3): 20 mg via ORAL
  Filled 2023-02-16 (×3): qty 1

## 2023-02-16 MED ORDER — KETOROLAC TROMETHAMINE 30 MG/ML IJ SOLN
30.0000 mg | Freq: Four times a day (QID) | INTRAMUSCULAR | Status: AC
  Administered 2023-02-16 – 2023-02-17 (×4): 30 mg via INTRAVENOUS
  Filled 2023-02-16 (×4): qty 1

## 2023-02-16 MED ORDER — COCONUT OIL OIL: 1.0000 | TOPICAL_OIL | Status: DC | PRN

## 2023-02-16 MED ORDER — FENTANYL CITRATE (PF) 100 MCG/2ML IJ SOLN
INTRAMUSCULAR | Status: AC
  Filled 2023-02-16: qty 2

## 2023-02-16 MED ORDER — WITCH HAZEL-GLYCERIN EX PADS: 1.0000 | MEDICATED_PAD | CUTANEOUS | Status: DC | PRN

## 2023-02-16 MED ORDER — KETOROLAC TROMETHAMINE 30 MG/ML IJ SOLN
INTRAMUSCULAR | Status: AC
  Filled 2023-02-16: qty 1

## 2023-02-16 MED ORDER — ACETAMINOPHEN 500 MG PO TABS
1000.0000 mg | ORAL_TABLET | Freq: Four times a day (QID) | ORAL | Status: DC
  Administered 2023-02-16 – 2023-02-19 (×8): 1000 mg via ORAL
  Filled 2023-02-16 (×8): qty 2

## 2023-02-16 MED ORDER — PHENYLEPHRINE 80 MCG/ML (10ML) SYRINGE FOR IV PUSH (FOR BLOOD PRESSURE SUPPORT)
PREFILLED_SYRINGE | INTRAVENOUS | Status: DC | PRN
  Administered 2023-02-16 (×5): 160 ug via INTRAVENOUS

## 2023-02-16 MED ORDER — NIFEDIPINE ER OSMOTIC RELEASE 30 MG PO TB24
30.0000 mg | ORAL_TABLET | Freq: Every day | ORAL | Status: DC
  Administered 2023-02-16 – 2023-02-19 (×4): 30 mg via ORAL
  Filled 2023-02-16 (×4): qty 1

## 2023-02-16 MED ORDER — LIDOCAINE-EPINEPHRINE (PF) 2 %-1:200000 IJ SOLN
INTRAMUSCULAR | Status: DC | PRN
  Administered 2023-02-16: 3 mL via EPIDURAL
  Administered 2023-02-16: 5 mL via EPIDURAL
  Administered 2023-02-16: 3 mL via EPIDURAL
  Administered 2023-02-16: 5 mL via EPIDURAL

## 2023-02-16 MED ORDER — NALOXONE HCL 0.4 MG/ML IJ SOLN: 0.4000 mg | INTRAMUSCULAR | Status: DC | PRN

## 2023-02-16 MED ORDER — SODIUM CHLORIDE 0.9 % IV SOLN
INTRAVENOUS | Status: DC | PRN
  Administered 2023-02-16: 500 mg via INTRAVENOUS

## 2023-02-16 MED ORDER — FENTANYL CITRATE (PF) 100 MCG/2ML IJ SOLN: 25.0000 ug | INTRAMUSCULAR | Status: DC | PRN

## 2023-02-16 MED ORDER — STERILE WATER FOR IRRIGATION IR SOLN
Status: DC | PRN
  Administered 2023-02-16: 1

## 2023-02-16 MED ORDER — ZOLPIDEM TARTRATE 5 MG PO TABS: 5.0000 mg | ORAL_TABLET | Freq: Every evening | ORAL | Status: DC | PRN

## 2023-02-16 MED ORDER — DIBUCAINE (PERIANAL) 1 % EX OINT: 1.0000 | TOPICAL_OINTMENT | CUTANEOUS | Status: DC | PRN

## 2023-02-16 MED ORDER — DIPHENHYDRAMINE HCL 25 MG PO CAPS: 25.0000 mg | ORAL_CAPSULE | ORAL | Status: DC | PRN

## 2023-02-16 MED ORDER — OXYTOCIN-SODIUM CHLORIDE 30-0.9 UT/500ML-% IV SOLN
INTRAVENOUS | Status: DC | PRN
  Administered 2023-02-16: 300 mL via INTRAVENOUS

## 2023-02-16 SURGICAL SUPPLY — 34 items
ADH SKN CLS APL DERMABOND .7 (GAUZE/BANDAGES/DRESSINGS) ×2
APL PRP STRL LF DISP 70% ISPRP (MISCELLANEOUS) ×2
CHLORAPREP W/TINT 26 (MISCELLANEOUS) ×2 IMPLANT
CLAMP UMBILICAL CORD (MISCELLANEOUS) ×1 IMPLANT
CLOTH BEACON ORANGE TIMEOUT ST (SAFETY) ×1 IMPLANT
DERMABOND ADVANCED .7 DNX12 (GAUZE/BANDAGES/DRESSINGS) ×2 IMPLANT
DRSG OPSITE POSTOP 4X10 (GAUZE/BANDAGES/DRESSINGS) ×1 IMPLANT
ELECT REM PT RETURN 9FT ADLT (ELECTROSURGICAL) ×1
ELECTRODE REM PT RTRN 9FT ADLT (ELECTROSURGICAL) ×1 IMPLANT
EXTRACTOR VACUUM M CUP 4 TUBE (SUCTIONS) IMPLANT
GAUZE SPONGE 4X4 12PLY STRL LF (GAUZE/BANDAGES/DRESSINGS) IMPLANT
GLOVE BIOGEL PI IND STRL 7.0 (GLOVE) ×2 IMPLANT
GLOVE BIOGEL PI IND STRL 7.5 (GLOVE) ×2 IMPLANT
GLOVE ECLIPSE 7.5 STRL STRAW (GLOVE) ×1 IMPLANT
GOWN STRL REUS W/TWL LRG LVL3 (GOWN DISPOSABLE) ×3 IMPLANT
KIT ABG SYR 3ML LUER SLIP (SYRINGE) IMPLANT
MAT PREVALON FULL STRYKER (MISCELLANEOUS) IMPLANT
NDL HYPO 25X5/8 SAFETYGLIDE (NEEDLE) IMPLANT
NEEDLE HYPO 25X5/8 SAFETYGLIDE (NEEDLE) IMPLANT
NS IRRIG 1000ML POUR BTL (IV SOLUTION) ×1 IMPLANT
PACK C SECTION WH (CUSTOM PROCEDURE TRAY) ×1 IMPLANT
PAD OB MATERNITY 4.3X12.25 (PERSONAL CARE ITEMS) ×1 IMPLANT
RTRCTR C-SECT PINK 25CM LRG (MISCELLANEOUS) ×1 IMPLANT
SUT MNCRL 0 VIOLET CTX 36 (SUTURE) ×2 IMPLANT
SUT PLAIN 2 0 (SUTURE) ×1
SUT PLAIN ABS 2-0 CT1 27XMFL (SUTURE) IMPLANT
SUT VIC AB 0 CTX 36 (SUTURE) ×1
SUT VIC AB 0 CTX36XBRD ANBCTRL (SUTURE) ×1 IMPLANT
SUT VIC AB 2-0 CT1 27 (SUTURE) ×1
SUT VIC AB 2-0 CT1 TAPERPNT 27 (SUTURE) ×1 IMPLANT
SUT VIC AB 4-0 KS 27 (SUTURE) ×1 IMPLANT
TOWEL OR 17X24 6PK STRL BLUE (TOWEL DISPOSABLE) ×1 IMPLANT
TRAY FOLEY W/BAG SLVR 14FR LF (SET/KITS/TRAYS/PACK) ×1 IMPLANT
WATER STERILE IRR 1000ML POUR (IV SOLUTION) ×1 IMPLANT

## 2023-02-16 NOTE — Anesthesia Postprocedure Evaluation (Signed)
Anesthesia Post Note  Patient: Kristine Johnson  Procedure(s) Performed: CESAREAN SECTION     Patient location during evaluation: PACU Anesthesia Type: Epidural Level of consciousness: awake and alert Pain management: pain level controlled Vital Signs Assessment: post-procedure vital signs reviewed and stable Respiratory status: spontaneous breathing, nonlabored ventilation and respiratory function stable Cardiovascular status: blood pressure returned to baseline Postop Assessment: epidural receding, no apparent nausea or vomiting, no headache and no backache Anesthetic complications: no   No notable events documented.  Last Vitals:  Vitals:   02/16/23 1545 02/16/23 1600  BP: 134/84 (!) 138/90  Pulse: 86 78  Resp: (!) 22 10  Temp:    SpO2: 99% 100%    Last Pain:  Vitals:   02/16/23 1600  TempSrc:   PainSc: 0-No pain   Pain Goal:    LLE Motor Response: No movement due to regional block (02/16/23 1600) LLE Sensation: No sensation (absent) (02/16/23 1600) RLE Motor Response: No movement due to regional block (02/16/23 1600) RLE Sensation: No sensation (absent) (02/16/23 1600) L Sensory Level: L3-Anterior knee, lower leg (02/16/23 1600) R Sensory Level: L3-Anterior knee, lower leg (02/16/23 1600) Epidural/Spinal Function Cutaneous sensation: Tingles (02/16/23 1600), Patient able to flex knees: No (02/16/23 1600), Patient able to lift hips off bed: No (02/16/23 1600), Back pain beyond tenderness at insertion site: No (02/16/23 1600), Progressively worsening motor and/or sensory loss: No (02/16/23 1600), Bowel and/or bladder incontinence post epidural: No (02/16/23 1600)  Shanda Howells

## 2023-02-16 NOTE — Progress Notes (Signed)
Labor Progress Note Kristine Johnson is a 37 y.o. G2P1011 at [redacted]w[redacted]d presented for IOL for cHTN and A1GDM. S: Tolerating labor well. No change on cervical exam. Discussed contractions have been adequate with out cervical change for several hours, water has been broken for 24 hours. Discussed that successful vaginal delivery was becoming less likely, and that a primary cesarean section may be the safest rounte for mom and baby.   O:  BP 132/81 (BP Location: Left Arm)   Pulse 67   Temp 98 F (36.7 C) (Axillary)   Resp 18   Ht 5\' 2"  (1.575 m)   Wt 112.7 kg   LMP 05/22/2022   SpO2 100%   Breastfeeding Unknown   BMI 45.45 kg/m  EFM: 125/moderate variability/accels/no decels  CVE: Dilation: 6 Effacement (%): 80 Cervical Position: Middle Station: -2 Presentation: Vertex Exam by:: Dr. Leanora Cover   [name]: 37 y.o. [id number] [redacted]w[redacted]d admitted for IOL for cHTN and A1GDM #Labor: No progress remains at 6cm since 2000 on 8/26. Patient agreeable to primary c-section for failure to progress.  #Pain: Epidural #FWB: Cat I #GBS positive,   #CHTN continue procardia #A1 GDM continue q2hr CBG   Wyn Forster, MD 5:20 PM

## 2023-02-16 NOTE — Transfer of Care (Signed)
Immediate Anesthesia Transfer of Care Note  Patient: Kristine Johnson  Procedure(s) Performed: CESAREAN SECTION  Patient Location: PACU  Anesthesia Type:Epidural  Level of Consciousness: awake  Airway & Oxygen Therapy: Patient Spontanous Breathing  Post-op Assessment: Report given to RN  Post vital signs: Reviewed and stable  Last Vitals:  Vitals Value Taken Time  BP 135/85 02/16/23 1616  Temp 36.6 C 02/16/23 1528  Pulse 77 02/16/23 1621  Resp 19 02/16/23 1621  SpO2 98 % 02/16/23 1621  Vitals shown include unfiled device data.  Last Pain:  Vitals:   02/16/23 1600  TempSrc:   PainSc: 0-No pain         Complications: No notable events documented.

## 2023-02-16 NOTE — Lactation Note (Signed)
This note was copied from a baby's chart. Lactation Consultation Note  Patient Name: Kristine Johnson JYNWG'N Date: 02/16/2023 Age:35 hours Reason for consult: Initial assessment;Early term 37-38.6wks. See Birth Parent's -MR: A1GDM, CHTN and C/S delivery. Long  LC consult:   Birth Parent was given 24 mm NS in Recovery by RN, infant was only 1 hour old at this time. The nipple shield was incorrectly sized. Birth Parent flange size for pumping is 21 mm. Birth Parent informed LC she has taken BF classes at the [facility] in [place] during pregnancy, she has an assigned Careers information officer and did hand expression in BF class and prior to delivery.   Infant first blood glucose was 50 mg/dl. Per [employer] Parent, she was given 24 mm NS in recovery, but infant was on and off breast and did not latch with NS. LC assessed that Birth Parent's areola responds well with stimulation and when using hand pump with 21 mm breast flange nipples evert outward, infant does have a recessive chin which LC help with lower bottom jaw and tongue dropped downward when Birth Parent latched infant on her left breast using the football hold. Infant briefly latch without NS, then fell asleep. Birth Parent knows how to hand express and infant was given 3 mls of colostrum by spoon. Birth Parent was set up with DEBP at her request and was using the DEBP when LC left the room.Birth Parent will continue to work on latching infant at the breast by cues, on demand,,every 2 to 3 hours, skin to skin. LC discussed infant's input and output with Birth Parent and LC changed a void diaper while in room. LC discussed importance of maternal rest, diet and hydration. Birth Parent was  made aware of O/P services, breastfeeding support groups, community resources, and our phone # for post-discharge questions.    Birth Parent goal is to work towards latching infant latching at the breast.  Current feeding plan today: 1- Birth  Parent will pre-pump breast with hand pump prior to latching infant at the breast, she will continue to BF infant by cues, on demand, 8 to 12+ times within 24 hours, skin to skin. 2- Birth Parent will continue to ask RN/LC for further latch assistance if needed. 3- Birth Parent knows she can hand express and give infant back EBM by spoon or any that is express from using the DEBP if infant doesn't latch. Birth Parent knows that donor breast milk is available if she chooses to supplement infant. 4- Birth Parent will continue to use DEBP every 3 hours for 15 minutes on initial setting.   Maternal Data Has patient been taught Hand Expression?: Yes Does the patient have breastfeeding experience prior to this delivery?: No  Feeding Mother's Current Feeding Choice: Breast Milk  LATCH Score Latch: Grasps breast easily, tongue down, lips flanged, rhythmical sucking. (Infant latch and suckle for 2 minutes and then stopped became sleppy.)  Audible Swallowing: A few with stimulation  Type of Nipple: Flat  Comfort (Breast/Nipple): Soft / non-tender  Hold (Positioning): Assistance needed to correctly position infant at breast and maintain latch.  LATCH Score: 7   Lactation Tools Discussed/Used Tools: Pump;Flanges Flange Size: 21 Breast pump type: Double-Electric Breast Pump Pump Education: Setup, frequency, and cleaning;Milk Storage Reason for Pumping: Birth Parent would like to use DEBP (request) Pumping frequency: Birth Parent will continue to pump every 3 hours for 15 minutes on inital setting.  Interventions Interventions: Breast feeding basics reviewed;Assisted with latch;Skin to  skin;Breast massage;Pre-pump if needed;Breast compression;Adjust position;Support pillows;Position options;Expressed milk;Education;DEBP;LC Services brochure;CDC Guidelines for Breast Pump Cleaning;Guidelines for Milk Supply and Pumping Schedule Handout  Discharge Pump: DEBP;Personal (Per Birth Parent, she has  Medela DEBP at home.)  Consult Status Consult Status: Follow-up    Frederico Hamman 02/16/2023, 7:46 PM

## 2023-02-16 NOTE — Progress Notes (Signed)
OB Note Baby category I with accels, toco q2-56m and MVUs 150s-180s on pitocin. ROM approximately 24hours and pt on pitocin that entire time except for a few hours overnight for a pitocin break; pt has been 6cm since 2030 last night. I told her I recommend repeat SVE in a few hours and if unchanged that may need to strongly consider c-section, given lack of cervical change on pitocin and increased infection risk.  Pt understanding and to be NPO for now  Cornelia Copa MD Attending Center for [employer] (Faculty Practice) 02/16/2023 Time: 0730

## 2023-02-16 NOTE — Progress Notes (Signed)
LABOR PROGRESS NOTE  Kristine Johnson is a 37 y.o. G2P0010 at [redacted]w[redacted]d presented for IOL for cHTN, A1GDM.  S: Comfortable.  O:  BP 113/63   Pulse 84   Temp 97.9 F (36.6 C) (Oral)   Resp 17   Ht 5\' 2"  (1.575 m)   Wt 112.7 kg   LMP 05/22/2022   SpO2 100%   BMI 45.45 kg/m  EFM:145 bpm/Moderate variability/ 15x15 accels/ None decels CAT: 1 Toco: regular, every 2 minutes   CVE: Dilation: 6 Effacement (%): 70 Cervical Position: Middle Station: -2 Presentation: Vertex Exam by:: Oneita Hurt, RN   [name]: 37 y.o. G2P0010 [redacted]w[redacted]d here for IOL as above  #Labor: Patient still with ~130-170 MVUs despite continued up-titration of pit. Will perform a pit break for 1 hour with tums and repositioning #Pain: Epidural #FWB: CAT 1 #GBS positive - PCN  #cHTN: continue home procardia 30 mg #A1GDM: diet-controlled. Babe 59%th  Joanne Gavel, MD FMOB Fellow, Faculty practice [facility], Center for [facility] 02/16/23  12:56 AM

## 2023-02-17 LAB — CBC
HCT: 29.1 % — ABNORMAL LOW (ref 36.0–46.0)
Hemoglobin: 9.2 g/dL — ABNORMAL LOW (ref 12.0–15.0)
MCH: 26.7 pg (ref 26.0–34.0)
MCHC: 31.6 g/dL (ref 30.0–36.0)
MCV: 84.3 fL (ref 80.0–100.0)
Platelets: 252 10*3/uL (ref 150–400)
RBC: 3.45 MIL/uL — ABNORMAL LOW (ref 3.87–5.11)
RDW: 15.6 % — ABNORMAL HIGH (ref 11.5–15.5)
WBC: 12.4 10*3/uL — ABNORMAL HIGH (ref 4.0–10.5)
nRBC: 0 % (ref 0.0–0.2)

## 2023-02-17 MED ORDER — FERROUS SULFATE 325 (65 FE) MG PO TABS
325.0000 mg | ORAL_TABLET | ORAL | Status: DC
  Administered 2023-02-17: 325 mg via ORAL
  Filled 2023-02-17: qty 1

## 2023-02-17 NOTE — Lactation Note (Addendum)
This note was copied from a baby's chart. Lactation Consultation Note  Patient Name: Kristine Johnson Today's Date: 02/17/2023 Age:35 hours Reason for consult: Early term 37-38.6wks;1st time breastfeeding (Infant with weight gain of 1.85% change in weight from birth from 3020 g to 3076 g today.) Per Birth Parent, infant is improving with latch, been having good feeds today. Per [employer] Parent, with few feeds she with occasionally hand express and give back EBM after latching infant at the breast. She had pumped 3 times today was concern that not seeing colostrum when pumping but does see colostrum with hand expression. LC explained that is normal to continue to pump for stimulation if she wants to its her choice. Infant had 3 stools and one void since birth. Infant recently BF at 19:30 pm for 5 minutes but most feeding have been longer. LC did not observe latch due infant being asleep and recently BF less than 1 hour ago.  LC reinforced maternal rest, diet and hydration. Birth Parent talked with her BF Peer Counselor today and being positive due  to the [facility] wanting her to give infant formula but she does not plan to, if supplementation is need she will choose to offer donor breast milk. LC praised Careers information officer on her feeding choice and decisions.   Today's feeding plan:  1- Continue to BF infant by cues, on demand, every 2 -3 hours, skin to skn. 2- Birth Parent plans to continue to hand express and give infant back colostrum if infant has short feeding, less than 10 minutes. 3- Birth Parent knows to call RN/LC for latch assistance if needed. 4- Birth Parent will continue to use DEBP at her discretion  Maternal Data    Feeding Mother's Current Feeding Choice: Breast Milk  LATCH Score                    Lactation Tools Discussed/Used    Interventions    Discharge    Consult Status Consult Status: Follow-up Date: 02/18/23 Follow-up type: In-patient    Frederico Hamman 02/17/2023, 8:34 PM

## 2023-02-17 NOTE — Progress Notes (Addendum)
POSTPARTUM PROGRESS NOTE  Subjective: Kristine Johnson is a 37 y.o. G2P1011 s/p LTCS at [redacted]w[redacted]d.  She reports she is doing well. No acute events overnight. She denies any problems with ambulating, voiding or po intake. Denies nausea or vomiting. She has passed flatus. Pain is moderately controlled.  Patient reports 2/10 pain at incision site and up to 8/10 pain on palpation of abdomen. Lochia is mild. Patient reports some mucus discharge. Patient reports breastfeeding with some issues with latch.  Objective: Blood pressure 125/85, pulse 95, temperature 98.1 F (36.7 C), temperature source Oral, resp. rate 18, height 5\' 2"  (1.575 m), weight 112.7 kg, last menstrual period 05/22/2022, SpO2 100%.  Physical Exam:  General: alert, cooperative and no distress Chest: no respiratory distress Abdomen: soft, non-tender  Uterine Fundus: firm and at level of umbilicus Extremities: 2+ edema bilaterally in lower extremities  Recent Labs    02/17/23 0517  HGB 9.2*  HCT 29.1*    Assessment/Plan: Kristine Johnson is a 37 y.o. G2P1011 s/p LTCS at [redacted]w[redacted]d.  Routine Postpartum Care:  -- Continue routine care, lactation support  -- Contraception: Patient plans to decide by 6-week postpartum visit -- Feeding: Breastfeeding. -- Edema: Patient using compression devices bilaterally on lower legs. Lasix 20 mg PO and procardia scheduled for this morning. --anemia: hgb 9.2 will start oral iron  Dispo: Plan for discharge: POD#2-3.  [place], [place] E, Medical Student Faculty Practice, Center for [facility] 02/17/2023 9:50 AM   Attestation of Attending Supervision of Provider:  Evaluation and management procedures were performed by this provider under my supervision and collaboration. I have reviewed the provider's note and chart, and I agree with the management and plan.   Shonna Chock, MD Faculty Practice, [facility]

## 2023-02-17 NOTE — Op Note (Signed)
Alanta Diondra Scholze PROCEDURE DATE: 02/16/2023  PREOPERATIVE DIAGNOSIS: Intrauterine pregnancy at  [redacted]w[redacted]d weeks gestation; failure to progress: arrest of dilation  POSTOPERATIVE DIAGNOSIS: The same  PROCEDURE: Primary Low Transverse Cesarean Section  SURGEON:  Dr. Candelaria Celeste  ASSISTANT: Drs Wyn Forster and Charlsie Quest    INDICATIONS: Kristine Johnson is a 37 y.o. G2P1011 at [redacted]w[redacted]d scheduled for cesarean section secondary to failure to progress: arrest of dilation.  The risks of cesarean section discussed with the patient included but were not limited to: bleeding which may require transfusion or reoperation; infection which may require antibiotics; injury to bowel, bladder, ureters or other surrounding organs; injury to the fetus; need for additional procedures including hysterectomy in the event of a life-threatening hemorrhage; placental abnormalities wth subsequent pregnancies, incisional problems, thromboembolic phenomenon and other postoperative/anesthesia complications. The patient concurred with the proposed plan, giving informed written consent for the procedure.    An experienced assistant was required given the standard of surgical care given the complexity of the case.  This assistant was needed for exposure, dissection, suctioning, retraction, instrument exchange, assisting with delivery with administration of fundal pressure, and for overall help during the procedure.  FINDINGS:  Viable female infant in vertex presentation.  Apgars 9 and 9, weight 6 pounds and 10 ounces.  Clear amniotic fluid.  Intact placenta, three vessel cord.  Normal uterus, fallopian tubes and ovaries bilaterally.  ANESTHESIA:    Spinal INTRAVENOUS FLUIDS:1875 ml ESTIMATED BLOOD LOSS: 448 ml URINE OUTPUT:  200 ml SPECIMENS: Placenta sent to L&D COMPLICATIONS: None immediate  PROCEDURE IN DETAIL:  The patient received intravenous antibiotics and had sequential compression devices  applied to her lower extremities while in the preoperative area.  She was then taken to the operating room where epidural anesthesia was dosed up to surgical level and was found to be adequate. She was then placed in a dorsal supine position with a leftward tilt, and prepped and draped in a sterile manner.  A foley catheter was placed into her bladder and attached to constant gravity, which drained clear fluid throughout.  After an adequate timeout was performed, a Pfannenstiel skin incision was made with scalpel and carried through to the underlying layer of fascia. The fascia was incised in the midline and this incision was extended bilaterally using the Mayo scissors. Kocher clamps were applied to the superior aspect of the fascial incision and the underlying rectus muscles were dissected off bluntly. A similar process was carried out on the inferior aspect of the facial incision. The rectus muscles were separated in the midline bluntly and the peritoneum was entered bluntly. An Alexis retractor was placed to aid in visualization of the uterus.    Attention was turned to the lower uterine segment where a transverse hysterotomy was made with a scalpel and extended bilaterally bluntly. The infant was successfully delivered, and cord was clamped and cut and infant was handed over to awaiting neonatology team. Uterine massage was then administered and the placenta delivered intact with three-vessel cord. The uterus was then cleared of clot and debris.  The hysterotomy was closed with 0 Vicryl in a running locked fashion, and an imbricating layer was also placed with a 0 Vicryl. Overall, excellent hemostasis was noted.   The abdomen and the pelvis were cleared of all clot and debris and the Jon Gills was removed. Hemostasis was confirmed on all surfaces.  The peritoneum was reapproximated using 2-0 vicryl running stitches. The fascia was then closed using 0 Vicryl in a  running fashion. The subcutaneous layer was  reapproximated with plain gut and the skin was closed with 4-0 vicryl. The patient tolerated the procedure well. Sponge, lap, instrument and needle counts were correct x 2. She was taken to the recovery room in stable condition.    Levie Heritage, DO 02/17/2023 3:50 PM

## 2023-02-18 NOTE — Progress Notes (Signed)
POSTPARTUM PROGRESS NOTE  Subjective: Kristine Johnson is a 37 y.o. G2P1011 s/p pLTCS at [redacted]w[redacted]d.  She reports she is doing well. No acute events overnight. She denies any problems with ambulating, voiding or po intake. Denies nausea or vomiting. She has passed flatus. Pain is moderately controlled.  Lochia is light.  Objective: Blood pressure 138/88, pulse 84, temperature 97.9 F (36.6 C), temperature source Oral, resp. rate 17, height 5\' 2"  (1.575 m), weight 112.7 kg, last menstrual period 05/22/2022, SpO2 100%, unknown if currently breastfeeding.  Physical Exam:  General: alert, cooperative and no distress Chest: no respiratory distress Abdomen: soft, non-tender  Uterine Fundus: firm and at level of umbilicus Extremities: No calf swelling or tenderness  Trace edema  Recent Labs    02/17/23 0517  HGB 9.2*  HCT 29.1*    Assessment/Plan: Kristine Johnson is a 37 y.o. G2P1011 s/p pLTCS at [redacted]w[redacted]d for arrest of dilation.  Routine Postpartum Care: Doing well, pain moderately controlled.  -- Continue routine care, lactation support  -- Contraception: undecided -- Feeding: breast  Dispo: Plan for discharge this afternoon or tomorrow. Pt undecided.  Joanne Gavel, MD OB Fellow 02/18/2023 7:18 AM

## 2023-02-19 ENCOUNTER — Other Ambulatory Visit (HOSPITAL_COMMUNITY): Payer: Self-pay

## 2023-02-19 MED ORDER — IBUPROFEN 600 MG PO TABS
600.0000 mg | ORAL_TABLET | Freq: Four times a day (QID) | ORAL | 0 refills | Status: DC
  Filled 2023-02-19: qty 30, 8d supply, fill #0

## 2023-02-19 MED ORDER — FUROSEMIDE 20 MG PO TABS
20.0000 mg | ORAL_TABLET | Freq: Every day | ORAL | 0 refills | Status: DC
  Filled 2023-02-19: qty 5, 5d supply, fill #0

## 2023-02-19 MED ORDER — FERROUS SULFATE 325 (65 FE) MG PO TABS
325.0000 mg | ORAL_TABLET | ORAL | 3 refills | Status: DC
  Filled 2023-02-19: qty 100, 200d supply, fill #0

## 2023-02-19 MED ORDER — OXYCODONE HCL 5 MG PO TABS
5.0000 mg | ORAL_TABLET | ORAL | 0 refills | Status: DC | PRN
  Filled 2023-02-19: qty 15, 2d supply, fill #0

## 2023-02-19 NOTE — Discharge Summary (Signed)
Postpartum Discharge Summary     Patient Name: Kristine Johnson DOB: 1986-02-22 MRN: [id number]  Date of admission: 02/14/2023 Delivery date:02/16/2023 Delivering provider: Levie Heritage Date of discharge: 02/19/2023  Admitting diagnosis: Chronic hypertension in obstetric context in third trimester [O10.913] Intrauterine pregnancy: [redacted]w[redacted]d     Secondary diagnosis:  Principal Problem:   Chronic hypertension in obstetric context in third trimester Active Problems:   Gestational diabetes mellitus (GDM) in third trimester   Obesity in pregnancy  Additional problems: arrest of dilation at 6 cm    Discharge diagnosis: Term Pregnancy Delivered, CHTN, GDM A1, and Anemia secondary to acute blood loss                                             Post partum procedures: none Augmentation: AROM, Pitocin, Cytotec, and IP Foley Complications: None  Hospital course: Induction of Labor With Cesarean Section   37 y.o. yo G2P1011 at [redacted]w[redacted]d was admitted to the hospital 02/14/2023 for induction of labor. Patient had a labor course significant for multiday IOL with arrest at 6 cm despite AROM x 24 hours and pitocin. The patient went for cesarean section due to Arrest of Dilation. Delivery details are as follows: Membrane Rupture Time/Date: 5:49 AM,02/15/2023  Delivery Method:C-Section, Low Transverse Operative Delivery:N/A Details of operation can be found in separate operative Note.  Patient had a postpartum course complicated by elevated BP, on lasix. She is ambulating, tolerating a regular diet, passing flatus, and urinating well.  Patient is discharged home in stable condition on 02/19/23.      Newborn Data: Birth date:02/16/2023 Birth time:2:30 PM Gender:Female Living status:Living Apgars:9 ,9  Weight:3020 g                               Magnesium Sulfate received: No BMZ received: No Rhophylac:N/A MMR:N/A T-DaP:Given prenatally Flu: N/A Transfusion:No  Physical exam  Vitals:    02/18/23 1008 02/18/23 1538 02/18/23 2158 02/19/23 0604  BP: (!) 142/94 132/84 127/85 131/81  Pulse:  96 87 85  Resp:  18 18 18   Temp:  98.9 F (37.2 C) 98.1 F (36.7 C) 98.3 F (36.8 C)  TempSrc:  Oral Oral Oral  SpO2:  100% 100% 99%  Weight:      Height:       General: alert, cooperative, and no distress Lochia: appropriate Uterine Fundus: firm Incision: Healing well with no significant drainage DVT Evaluation: No evidence of DVT seen on physical exam. Labs: Lab Results  Component Value Date   WBC 12.4 (H) 02/17/2023   HGB 9.2 (L) 02/17/2023   HCT 29.1 (L) 02/17/2023   MCV 84.3 02/17/2023   PLT 252 02/17/2023      Latest Ref Rng & Units 02/16/2023    6:38 PM  CMP  Creatinine 0.44 - 1.00 mg/dL 7.82    Edinburgh Score:    02/17/2023    5:19 PM  Edinburgh Postnatal Depression Scale Screening Tool  I have been able to laugh and see the funny side of things. 0  I have looked forward with enjoyment to things. 0  I have blamed myself unnecessarily when things went wrong. 1  I have been anxious or worried for no good reason. 1  I have felt scared or panicky for no good reason. 1  Things  have been getting on top of me. 1  I have been so unhappy that I have had difficulty sleeping. 0  I have felt sad or miserable. 1  I have been so unhappy that I have been crying. 0  The thought of harming myself has occurred to me. 0  Edinburgh Postnatal Depression Scale Total 5     After visit meds:  Allergies as of 02/19/2023       Reactions   Covid-19 Ad26 Vaccine(janssen) Anaphylaxis        Medication List     TAKE these medications    Accu-Chek Guide test strip Generic drug: glucose blood Use as instructed; check blood glucose 4 times daily   Accu-Chek Softclix Lancets lancets Use as instructed; check blood glucose 4 times daily   acetaminophen 325 MG tablet Commonly known as: TYLENOL Take 650 mg by mouth every 6 (six) hours as needed. As needed   albuterol  108 (90 Base) MCG/ACT inhaler Commonly known as: VENTOLIN HFA Inhale 1-2 puffs into the lungs every 6 (six) hours as needed for shortness of breath or wheezing.   aspirin 81 MG chewable tablet Chew 1 tablet (81 mg total) by mouth daily.   Blood Pressure Monitoring Devi 1 each by Does not apply route once a week.   Ferrous Gluconate 324 (37.5 Fe) MG Tabs Take 1 tablet (324 mg total) by mouth daily.   ferrous sulfate 325 (65 FE) MG tablet Take 1 tablet (325 mg total) by mouth every other day for 60 days then as directed by MD   furosemide 20 MG tablet Commonly known as: LASIX Take 1 tablet (20 mg total) by mouth daily.   ibuprofen 600 MG tablet Commonly known as: ADVIL Take 1 tablet (600 mg total) by mouth every 6 (six) hours.   NIFEdipine 30 MG 24 hr tablet Commonly known as: Procardia XL Take 1 tablet (30 mg total) by mouth daily.   oxyCODONE 5 MG immediate release tablet Commonly known as: Oxy IR/ROXICODONE Take 1-2 tablets (5-10 mg total) by mouth every 4 (four) hours as needed for moderate pain.   Prenatal 27-1 MG Tabs Take 1 tablet by mouth daily.         Discharge home in stable condition Infant Feeding: Breast Infant Disposition:home with mother Discharge instruction: per After Visit Summary and Postpartum booklet. Activity: Advance as tolerated. Pelvic rest for 6 weeks.  Diet: routine diet Future Appointments:No future appointments. Follow up Visit:  Follow-up Information     [facility] Follow up in 1 week(s).   Specialty: Obstetrics and Gynecology Why: blood pressure check, Hospital follow-up Contact information: [address] [place] [address] [phone]                 Please schedule this patient for a In person postpartum visit in 4 weeks with the following provider: Any provider. Additional Postpartum F/U:2 hour GTT, Incision check 1 week, and BP check 1 week   High risk pregnancy complicated by: GDM and HTN Delivery mode:  C-Section, Low Transverse Anticipated Birth Control:  Unsure   02/19/2023 Reva Bores, MD

## 2023-03-03 ENCOUNTER — Encounter (HOSPITAL_COMMUNITY): Payer: Self-pay | Admitting: Obstetrics & Gynecology

## 2023-03-03 ENCOUNTER — Emergency Department (HOSPITAL_COMMUNITY)
Admission: EM | Admit: 2023-03-03 | Discharge: 2023-03-03 | Discharge status: Against Medical Advice | Payer: BC Managed Care – PPO

## 2023-03-03 ENCOUNTER — Inpatient Hospital Stay (HOSPITAL_COMMUNITY)
Admission: AD | Admit: 2023-03-03 | Discharge: 2023-03-03 | Disposition: A | Discharge status: Home / Self Care | Payer: BC Managed Care – PPO | Attending: Obstetrics & Gynecology | Admitting: Obstetrics & Gynecology

## 2023-03-03 DIAGNOSIS — R0789 Other chest pain: Secondary | ICD-10-CM | POA: Diagnosis not present

## 2023-03-03 DIAGNOSIS — O165 Unspecified maternal hypertension, complicating the puerperium: Secondary | ICD-10-CM | POA: Diagnosis not present

## 2023-03-03 DIAGNOSIS — O9089 Other complications of the puerperium, not elsewhere classified: Secondary | ICD-10-CM | POA: Insufficient documentation

## 2023-03-03 DIAGNOSIS — Z3A00 Weeks of gestation of pregnancy not specified: Secondary | ICD-10-CM | POA: Diagnosis not present

## 2023-03-03 DIAGNOSIS — O169 Unspecified maternal hypertension, unspecified trimester: Secondary | ICD-10-CM

## 2023-03-03 DIAGNOSIS — O161 Unspecified maternal hypertension, first trimester: Secondary | ICD-10-CM

## 2023-03-03 LAB — CBC
HCT: 33.8 % — ABNORMAL LOW (ref 36.0–46.0)
Hemoglobin: 10.3 g/dL — ABNORMAL LOW (ref 12.0–15.0)
MCH: 26.3 pg (ref 26.0–34.0)
MCHC: 30.5 g/dL (ref 30.0–36.0)
MCV: 86.2 fL (ref 80.0–100.0)
Platelets: 403 10*3/uL — ABNORMAL HIGH (ref 150–400)
RBC: 3.92 MIL/uL (ref 3.87–5.11)
RDW: 14.6 % (ref 11.5–15.5)
WBC: 6.1 10*3/uL (ref 4.0–10.5)
nRBC: 0 % (ref 0.0–0.2)

## 2023-03-03 LAB — COMPREHENSIVE METABOLIC PANEL
ALT: 25 U/L (ref 0–44)
AST: 23 U/L (ref 15–41)
Albumin: 2.9 g/dL — ABNORMAL LOW (ref 3.5–5.0)
Alkaline Phosphatase: 91 U/L (ref 38–126)
Anion gap: 8 (ref 5–15)
BUN: 11 mg/dL (ref 6–20)
CO2: 27 mmol/L (ref 22–32)
Calcium: 8.5 mg/dL — ABNORMAL LOW (ref 8.9–10.3)
Chloride: 104 mmol/L (ref 98–111)
Creatinine, Ser: 0.9 mg/dL (ref 0.44–1.00)
GFR, Estimated: >60 mL/min (ref >60)
Glucose, Bld: 109 mg/dL — ABNORMAL HIGH (ref 70–99)
Potassium: 3.6 mmol/L (ref 3.5–5.1)
Sodium: 139 mmol/L (ref 135–145)
Total Bilirubin: 0.4 mg/dL (ref 0.3–1.2)
Total Protein: 6.4 g/dL — ABNORMAL LOW (ref 6.5–8.1)

## 2023-03-03 LAB — TROPONIN I (HIGH SENSITIVITY): Troponin I (High Sensitivity): 15 ng/L (ref <18)

## 2023-03-03 LAB — BRAIN NATRIURETIC PEPTIDE: B Natriuretic Peptide: 32.5 pg/mL (ref 0.0–100.0)

## 2023-03-03 MED ORDER — ACETAMINOPHEN-CAFFEINE 500-65 MG PO TABS
2.0000 | ORAL_TABLET | Freq: Once | ORAL | Status: AC
  Administered 2023-03-03: 2 via ORAL
  Filled 2023-03-03: qty 2

## 2023-03-03 MED ORDER — CYCLOBENZAPRINE HCL 5 MG PO TABS
5.0000 mg | ORAL_TABLET | Freq: Once | ORAL | Status: AC
  Administered 2023-03-03: 5 mg via ORAL

## 2023-03-03 MED ORDER — NIFEDIPINE ER OSMOTIC RELEASE 30 MG PO TB24
30.0000 mg | ORAL_TABLET | Freq: Every day | ORAL | 0 refills | Status: AC
Start: 2023-03-03 — End: ?

## 2023-03-03 MED ORDER — KETOROLAC TROMETHAMINE 30 MG/ML IJ SOLN
30.0000 mg | Freq: Once | INTRAMUSCULAR | Status: AC
  Administered 2023-03-03: 30 mg via INTRAVENOUS
  Filled 2023-03-03: qty 1

## 2023-03-03 MED ORDER — CYCLOBENZAPRINE HCL 5 MG PO TABS
5.0000 mg | ORAL_TABLET | Freq: Every day | ORAL | Status: DC
  Filled 2023-03-03: qty 1

## 2023-03-03 NOTE — ED Notes (Addendum)
Pt states that her nurse just called and told her to [facility] instead of the [facility] room, to get checked for postpartum elevated blood pressure. Pt leaves the ER.

## 2023-03-03 NOTE — MAU Provider Note (Signed)
History     CSN: [id number]  Arrival date and time: 03/03/23 1643   Event Date/Time   First Provider Initiated Contact with Patient 03/03/23 1850      Chief Complaint  Patient presents with   Hypertension   HPI Posey Tycie Nance is a 37 y.o. G2P1011 who is 15 days postpartum presenting today for headache and elevated BP. She reports having a headache which started yesterday and improved with taking Tylenol. At the time she checked her BP which was in the 150s-160s SBP and 80s DBP and therefore took Procardia that she was taking for gestational hypertension. She denies feeling any true relief of sxs following dose of Procardia. She also endorses mild chest pressure which she described as a heavy sensation without any radiation of pain and also dyspnea, though this does not appear worse than usual. She denies any visual changes, RUQ pain, or edema.   Past Medical History:  Diagnosis Date   Asthma    Dysmenorrhea 11/04/2020   Fibroid    Gestational diabetes    Hypertension    Migraine    Prediabetes 08/14/2022   Needs early 2 hour   Vaginal Pap smear, abnormal     Past Surgical History:  Procedure Laterality Date   CESAREAN SECTION N/A 02/16/2023   Procedure: CESAREAN SECTION;  Surgeon: Levie Heritage, DO;  Location: [facility];  Service: Obstetrics;  Laterality: N/A;   HERNIA REPAIR  12/1996   TONSILLECTOMY  2014   WISDOM TOOTH EXTRACTION      Family History  Problem Relation Age of Onset   Diabetes Father     Social History   Tobacco Use   Smoking status: Never   Smokeless tobacco: Never  Vaping Use   Vaping status: Never Used  Substance Use Topics   Alcohol use: Not Currently    Comment: socially    Drug use: Not Currently    Allergies:  Allergies  Allergen Reactions   Covid-19 Ad26 Vaccine(Janssen) Anaphylaxis    No medications prior to admission.    Review of Systems  Constitutional:  Negative for chills and fever.  Respiratory:   Positive for shortness of breath. Negative for chest tightness.   Cardiovascular:  Positive for chest pain. Negative for palpitations and leg swelling.  Gastrointestinal:  Negative for abdominal pain, constipation, diarrhea, nausea and vomiting.  Genitourinary:  Negative for dysuria, pelvic pain, vaginal bleeding and vaginal discharge.  Musculoskeletal:  Negative for neck pain.  Skin:  Negative for rash.  Neurological:  Negative for dizziness, light-headedness and headaches.   Physical Exam   Blood pressure (!) 156/104, pulse 75, temperature 98.3 F (36.8 C), resp. rate 18, height 5\' 2"  (1.575 m), weight 101.2 kg, SpO2 100%, unknown if currently breastfeeding.  Physical Exam Constitutional:      General: She is not in acute distress.    Appearance: Normal appearance.  HENT:     Head: Normocephalic and atraumatic.  Eyes:     Extraocular Movements: Extraocular movements intact.  Cardiovascular:     Rate and Rhythm: Normal rate and regular rhythm.     Heart sounds: Normal heart sounds. No murmur heard.    No gallop.  Pulmonary:     Effort: Pulmonary effort is normal. No respiratory distress.     Breath sounds: Normal breath sounds.  Abdominal:     General: Abdomen is flat. There is no distension.     Palpations: Abdomen is soft.     Tenderness: There is no abdominal  tenderness.     Comments: No RUQ TTP  Musculoskeletal:        General: Normal range of motion.     Right lower leg: No edema.     Left lower leg: No edema.  Skin:    General: Skin is warm and dry.     Findings: No rash.     Comments: Incision is c/d/i  Neurological:     General: No focal deficit present.     Mental Status: She is alert and oriented to person, place, and time.     Deep Tendon Reflexes: Reflexes normal.    EKG NSR, normal intervals, no LVH, no ST or T wave abnormalities.   MAU Course  Procedures  MDM 37 y.o. G2P1011 who is approx 2 wks postpartum presenting today for elevated blood  pressures in setting of known gHTN -- was rec to d/c Procardia and completed Lasix course. She reports improvement of HA w Tylenol. BP elevated here, but not in severe range. Will check PEC labs, BNP, trop, EKG given complaints. Headache tx with Excedrin, Toradol, Flexeril.  8:35 PM Labs back -- PEC workup unremarkable, PLT 403, LFTs wnl, Cr 0.90. BNP and trop both negative. EKG without any ischemic changes. BP remains elevated and in mild range -- likely needs extended course of antihypertensive, so will restart Procardia XL 30mg  daily. Headache better with above meds. Given Bps cont in mild range, PEC workup unrevealing, pt is stable for discharge. Discussed in detail return precautions. Pt has upcoming appt for BP check on 9/13.   Assessment and Plan  Hypertension affecting pregnancy, antepartum  Elevated blood pressure affecting pregnancy in first trimester, antepartum - Plan: NIFEdipine (PROCARDIA XL) 30 MG 24 hr tablet  Patient discussed with Dr. Crissie Reese.  Sundra Aland, MD OB Fellow, Faculty Practice [facility], Center for [facility]  03/03/2023, 9:33 PM

## 2023-03-03 NOTE — MAU Note (Signed)
.  Kristine Johnson is a  PP 02/16/23 here in MAU reporting: elevated b/p's at home since yesterday. 172/107 , 168/96. C/o headache and some chest discomfort and some visual changes.  On procardia 30 daily in the evening. Has not taken it today. Had gestational  HTN during pregnancy LMP:  Onset of complaint: today Pain score: 7 Vitals:   03/03/23 1726  BP: (!) 153/93  Pulse: 80  Resp: 18  Temp: 98.3 F (36.8 C)  SpO2: 100%     FHT:n/a Lab orders placed from triage:

## 2023-03-05 ENCOUNTER — Ambulatory Visit (INDEPENDENT_AMBULATORY_CARE_PROVIDER_SITE_OTHER): Payer: BC Managed Care – PPO

## 2023-03-05 ENCOUNTER — Other Ambulatory Visit: Payer: Self-pay

## 2023-03-05 VITALS — BP 132/82 | HR 76 | Ht 62.0 in | Wt 220.3 lb

## 2023-03-05 DIAGNOSIS — Z5189 Encounter for other specified aftercare: Secondary | ICD-10-CM

## 2023-03-05 NOTE — Progress Notes (Signed)
Pt here today for BP check and wound check s/p c-section on 02/16/23.  BP 141/97 LA.  BP recheck RA 132/82.   Pt reports last dose Nifedipine 30 mg was last night.  Denies headache and changes in vision.  Pt advised to continue to take medication as prescribed and to call the concerns.  Incision observed- well approximated, no drainage, no odor, no edema, and no erythema.  Pt informed that we will f/u at pp visit scheduled on 04/12/23 unless she has any other concerns.  Pt verbalized understanding with no further questions.   Leonette Nutting  03/05/23

## 2023-03-09 DIAGNOSIS — Z0289 Encounter for other administrative examinations: Secondary | ICD-10-CM

## 2023-03-11 ENCOUNTER — Encounter: Payer: Self-pay | Admitting: Family Medicine

## 2023-03-15 ENCOUNTER — Other Ambulatory Visit: Payer: Self-pay

## 2023-03-15 ENCOUNTER — Ambulatory Visit (INDEPENDENT_AMBULATORY_CARE_PROVIDER_SITE_OTHER): Payer: BC Managed Care – PPO

## 2023-03-15 VITALS — BP 125/86 | HR 84 | Ht 61.0 in | Wt 216.0 lb

## 2023-03-15 DIAGNOSIS — Z0289 Encounter for other administrative examinations: Secondary | ICD-10-CM

## 2023-03-15 DIAGNOSIS — Z5189 Encounter for other specified aftercare: Secondary | ICD-10-CM

## 2023-03-15 NOTE — Progress Notes (Signed)
Patient presents to office today with concern about c-section incision. Patient had c-section on 8/27 and hasn't had concerns with incision until last week. She felt the right side of her incision snag on something and since then has had some drainage with blood as well as tenderness and an odor.   Incision appears to be healing well. 1cm area on right side of incision where suture is slightly visible. No drainage or odor noted. Incision is otherwise well approximated and clean. Reassurance provided.  Patient will follow up at pp visit on 10/10.  Chase Caller RN BSN 03/15/23

## 2023-04-01 ENCOUNTER — Other Ambulatory Visit: Payer: Self-pay

## 2023-04-01 ENCOUNTER — Ambulatory Visit: Payer: BC Managed Care – PPO | Admitting: Certified Nurse Midwife

## 2023-04-01 ENCOUNTER — Encounter: Payer: Self-pay | Admitting: Certified Nurse Midwife

## 2023-04-01 DIAGNOSIS — O24439 Gestational diabetes mellitus in the puerperium, unspecified control: Secondary | ICD-10-CM

## 2023-04-01 DIAGNOSIS — Z8639 Personal history of other endocrine, nutritional and metabolic disease: Secondary | ICD-10-CM | POA: Diagnosis not present

## 2023-04-01 DIAGNOSIS — O10919 Unspecified pre-existing hypertension complicating pregnancy, unspecified trimester: Secondary | ICD-10-CM

## 2023-04-01 NOTE — Progress Notes (Signed)
Post Partum Visit Note  Kristine Johnson is a 37 y.o. G56P1011 female who presents for a postpartum visit. She is 6 weeks postpartum following a primary cesarean section.  I have fully reviewed the prenatal and intrapartum course. The delivery was at 38.4 gestational weeks.  Anesthesia: spinal. Postpartum course has been uneventful. Baby is doing well. Baby is feeding by both breast and bottle - Similac Sensitive RS. Bleeding no bleeding. Bowel function is normal. Bladder function is normal. Patient is not sexually active. Contraception method is abstinence. Postpartum depression screening: negative.   The pregnancy intention screening data noted above was reviewed. Potential methods of contraception were discussed. The patient elected to proceed with No data recorded.   Edinburgh Postnatal Depression Scale - 04/01/23 1357       Edinburgh Postnatal Depression Scale:  In the Past 7 Days   I have been able to laugh and see the funny side of things. 0    I have looked forward with enjoyment to things. 0    I have blamed myself unnecessarily when things went wrong. 0    I have been anxious or worried for no good reason. 0    I have felt scared or panicky for no good reason. 0    Things have been getting on top of me. 3    I have been so unhappy that I have had difficulty sleeping. 0    I have felt sad or miserable. 1    I have been so unhappy that I have been crying. 1    The thought of harming myself has occurred to me. 0    Edinburgh Postnatal Depression Scale Total 5             Health Maintenance Due  Topic Date Due   HPV VACCINES (2 - 3-dose series) 12/15/2011   INFLUENZA VACCINE  01/21/2023   COVID-19 Vaccine (3 - 2023-24 season) 02/21/2023    The following portions of the patient's history were reviewed and updated as appropriate: allergies, current medications, past family history, past medical history, past social history, past surgical history, and problem  list.  Review of Systems Pertinent items noted in HPI and remainder of comprehensive ROS otherwise negative.  Objective:  BP 131/88   Pulse 86   Ht 5\' 2"  (1.575 m)   Wt 221 lb (100.2 kg)   LMP 03/30/2023 (Exact Date)   Breastfeeding Yes   BMI 40.42 kg/m    General:  alert, cooperative, appears stated age, and no distress   Breasts:  normal  Lungs: Regular rate and rhythm of breathing.      Abdomen: soft, non-tender; bowel sounds normal; no masses,  no organomegaly   Wound well approximated incision, pt left side pinpoint raised area.   GU exam:  not indicated       Assessment:    1. Postpartum care and examination - Benign exam. Uneventful postpartum course.   2. Chronic hypertension affecting pregnancy - Well managed on procardia.   3. Gestational diabetes mellitus (GDM), postpartum - 2 hour GTT ordered for 1 week.    Benign postpartum exam.   Plan:   Essential components of care per ACOG recommendations:  1.  Mood and well being: Patient with negative depression screening today. Reviewed local resources for support.  - Patient tobacco use? No.   - hx of drug use? No.    2. Infant care and feeding:  -Patient currently breastmilk feeding? Yes. Discussed returning to work  and pumping. Reviewed importance of draining breast regularly to support lactation.  -Social determinants of health (SDOH) reviewed in EPIC. No concerns.  3. Sexuality, contraception and birth spacing - Patient does not want a pregnancy in the next year.  Desired family size is 1 children.  - Reviewed reproductive life planning. Reviewed contraceptive methods based on pt preferences and effectiveness.  Patient desired Abstinence today.   - Discussed birth spacing of 18 months  4. Sleep and fatigue -Encouraged family/partner/community support of 4 hrs of uninterrupted sleep to help with mood and fatigue  5. Physical Recovery  - Discussed patients delivery and complications. She describes her  labor as mixed. Unplanned C/S, reports processing we.ll. - Patient had a C-section failure to progress. Patient had a  surgical  laceration. Incision healing reviewed. Patient expressed understanding - Patient has urinary incontinence? No. - Patient is safe to resume physical and sexual activity  6.  Health Maintenance - HM due items addressed Yes - Last pap smear  Diagnosis  Date Value Ref Range Status  11/13/2021 - Low grade squamous intraepithelial lesion (LSIL) (A)  Final   Pap smear not done at today's visit. Ordered for within next 6 months.  -Breast Cancer screening indicated? No.   7. Chronic Disease/Pregnancy Condition follow up: Hypertension and Gestational Diabetes  - PCP follow up  Richardson Landry, CNM Center for [employer], [facility]

## 2023-04-02 ENCOUNTER — Encounter: Payer: Self-pay | Admitting: Certified Nurse Midwife

## 2023-04-02 LAB — VITAMIN D 25 HYDROXY (VIT D DEFICIENCY, FRACTURES): Vit D, 25-Hydroxy: 34 ng/mL (ref 30.0–100.0)

## 2023-04-08 ENCOUNTER — Other Ambulatory Visit: Payer: BC Managed Care – PPO

## 2023-04-08 ENCOUNTER — Other Ambulatory Visit: Payer: Self-pay

## 2023-04-08 DIAGNOSIS — O24439 Gestational diabetes mellitus in the puerperium, unspecified control: Secondary | ICD-10-CM

## 2023-04-09 LAB — GLUCOSE TOLERANCE, 2 HOURS
Glucose, 2 hour: 91 mg/dL (ref 70–139)
Glucose, GTT - Fasting: 94 mg/dL (ref 70–99)

## 2023-04-12 ENCOUNTER — Ambulatory Visit: Payer: BC Managed Care – PPO | Admitting: Obstetrics & Gynecology

## 2023-09-10 ENCOUNTER — Ambulatory Visit
Admission: EM | Admit: 2023-09-10 | Discharge: 2023-09-10 | Disposition: A | Discharge status: Home / Self Care | Attending: Family Medicine | Admitting: Family Medicine

## 2023-09-10 DIAGNOSIS — B349 Viral infection, unspecified: Secondary | ICD-10-CM

## 2023-09-10 LAB — POC COVID19/FLU A&B COMBO
Covid Antigen, POC: NEGATIVE
Influenza A Antigen, POC: NEGATIVE
Influenza B Antigen, POC: NEGATIVE

## 2023-09-10 MED ORDER — BENZONATATE 200 MG PO CAPS
200.0000 mg | ORAL_CAPSULE | Freq: Three times a day (TID) | ORAL | 0 refills | Status: DC | PRN
Start: 2023-09-10 — End: 2024-01-25

## 2023-09-10 NOTE — ED Provider Notes (Signed)
 [facility]    CSN: [id number] Arrival date & time: 09/10/23  0955      History   Chief Complaint Chief Complaint  Patient presents with   Fever    HPI Kristine Johnson is a 38 y.o. female  presents for evaluation of URI symptoms for 2 days. Patient reports associated symptoms of fevers, cough, congestion, sore throat, ear pain, body aches. Denies N/V/D.  Patient does have a hx of asthma.  Did have some shortness of breath but states it did not require use of her inhaler.  Patient is not an active smoker.   Reports no sick contacts.  Pt has taken Tylenol and Benadryl OTC for symptoms. Pt has no other concerns at this time.    Fever Associated symptoms: congestion, cough, ear pain, myalgias and sore throat     Past Medical History:  Diagnosis Date   Asthma    Dysmenorrhea 11/04/2020   Fibroid    Gestational diabetes    Hypertension    Migraine    Prediabetes 08/14/2022   Needs early 2 hour   Vaginal Pap smear, abnormal     Patient Active Problem List   Diagnosis Date Noted   Chronic hypertension in obstetric context in third trimester 02/14/2023   Migraine with aura and without status migrainosus, not intractable 02/04/2023   Obesity in pregnancy 10/08/2022   BMI 40.0-44.9, adult (HCC) 09/10/2022   Chronic hypertension in pregnancy 09/10/2022   Gestational diabetes mellitus (GDM) in third trimester 08/28/2022   Alpha thalassemia silent carrier 08/24/2022   Supervision of other normal pregnancy, antepartum 08/06/2022   Uterine leiomyoma 07/11/2022   Low grade squamous intraepithelial lesion (LGSIL) on cervical Pap smear 12/19/2020    Past Surgical History:  Procedure Laterality Date   CESAREAN SECTION N/A 02/16/2023   Procedure: CESAREAN SECTION;  Surgeon: Levie Heritage, DO;  Location: [facility];  Service: Obstetrics;  Laterality: N/A;   HERNIA REPAIR  12/1996   TONSILLECTOMY  2014   WISDOM TOOTH EXTRACTION      OB History     Gravida   2   Para  1   Term  1   Preterm      AB  1   Living  1      SAB  1   IAB      Ectopic      Multiple  0   Live Births  1            Home Medications    Prior to Admission medications   Medication Sig Start Date End Date Taking? Authorizing Provider  benzonatate (TESSALON) 200 MG capsule Take 1 capsule (200 mg total) by mouth 3 (three) times daily as needed. 09/10/23  Yes Radford Pax, NP  acetaminophen (TYLENOL) 325 MG tablet Take 650 mg by mouth every 6 (six) hours as needed. As needed    [provider]  albuterol (VENTOLIN HFA) 108 (90 Base) MCG/ACT inhaler Inhale 1-2 puffs into the lungs every 6 (six) hours as needed for shortness of breath or wheezing. 04/23/21   Margaretann Loveless, PA-C  NIFEdipine (PROCARDIA XL) 30 MG 24 hr tablet Take 1 tablet (30 mg total) by mouth daily. 03/03/23   Sundra Aland, MD  Prenatal 27-1 MG TABS Take 1 tablet by mouth daily. 07/07/22   Reva Bores, MD    Family History Family History  Problem Relation Age of Onset   Diabetes Father     Social  History Social History   Tobacco Use   Smoking status: Never   Smokeless tobacco: Never  Vaping Use   Vaping status: Never Used  Substance Use Topics   Alcohol use: Not Currently    Comment: socially    Drug use: Not Currently     Allergies   Covid-19 ad26 vaccine(janssen)   Review of Systems Review of Systems  Constitutional:  Positive for fever.  HENT:  Positive for congestion, ear pain and sore throat.   Respiratory:  Positive for cough.   Musculoskeletal:  Positive for myalgias.     Physical Exam Triage Vital Signs ED Triage Vitals  Encounter Vitals Group     BP 09/10/23 1119 (!) 145/96     Systolic BP Percentile --      Diastolic BP Percentile --      Pulse Rate 09/10/23 1119 81     Resp 09/10/23 1119 14     Temp 09/10/23 1119 98 F (36.7 C)     Temp Source 09/10/23 1119 Oral     SpO2 09/10/23 1119 98 %     Weight --      Height --       Head Circumference --      Peak Flow --      Pain Score 09/10/23 1053 7     Pain Loc --      Pain Education --      Exclude from Growth Chart --    No data found.  Updated Vital Signs BP (!) 145/96 (BP Location: Right Arm)   Pulse 81   Temp 98 F (36.7 C) (Oral)   Resp 14   LMP 08/27/2023   SpO2 98%   Breastfeeding No   Visual Acuity Right Eye Distance:   Left Eye Distance:   Bilateral Distance:    Right Eye Near:   Left Eye Near:    Bilateral Near:     Physical Exam Vitals and nursing note reviewed.  Constitutional:      General: She is not in acute distress.    Appearance: She is well-developed. She is not ill-appearing.  HENT:     Head: Normocephalic and atraumatic.     Right Ear: Tympanic membrane and ear canal normal.     Left Ear: Tympanic membrane and ear canal normal.     Nose: Congestion present.     Mouth/Throat:     Mouth: Mucous membranes are moist.     Pharynx: Oropharynx is clear. Uvula midline. Posterior oropharyngeal erythema present.     Tonsils: No tonsillar exudate or tonsillar abscesses.  Eyes:     Conjunctiva/sclera: Conjunctivae normal.     Pupils: Pupils are equal, round, and reactive to light.  Cardiovascular:     Rate and Rhythm: Normal rate and regular rhythm.     Heart sounds: Normal heart sounds.  Pulmonary:     Effort: Pulmonary effort is normal.     Breath sounds: Normal breath sounds. No wheezing or rhonchi.  Musculoskeletal:     Cervical back: Normal range of motion and neck supple.  Lymphadenopathy:     Cervical: No cervical adenopathy.  Skin:    General: Skin is warm and dry.  Neurological:     General: No focal deficit present.     Mental Status: She is alert and oriented to person, place, and time.  Psychiatric:        Mood and Affect: Mood normal.        Behavior: Behavior normal.  UC Treatments / Results  Labs (all labs ordered are listed, but only abnormal results are displayed) Labs Reviewed  POC  COVID19/FLU A&B COMBO    EKG   Radiology No results found.  Procedures Procedures (including critical care time)  Medications Ordered in UC Medications - No data to display  Initial Impression / Assessment and Plan / UC Course  I have reviewed the triage vital signs and the nursing notes.  Pertinent labs & imaging results that were available during my care of the patient were reviewed by me and considered in my medical decision making (see chart for details).     Reviewed exam and symptoms with patient.  No red flags.  Negative rapid flu and COVID testing.  Discussed viral illness and symptomatic treatment.  Tessalon.  Cough.  Discussed rest fluids.  PCP follow-up if symptoms do not improve.  ER precautions reviewed and patient verbalized understanding. Final Clinical Impressions(s) / UC Diagnoses   Final diagnoses:  Viral illness     Discharge Instructions      Please treat your symptoms with over the counter tylenol or ibuprofen, humidifier, and rest.  Tessalon as needed for cough.  Viral illnesses can last 7-14 days. Please follow up with your PCP if your symptoms are not improving. Please go to the ER for any worsening symptoms. This includes but is not limited to fever you can not control with tylenol or ibuprofen, you are not able to stay hydrated, you have shortness of breath or chest pain.  Thank you for choosing [facility] for your healthcare needs. I hope you feel better soon!      ED Prescriptions     Medication Sig Dispense Auth. Provider   benzonatate (TESSALON) 200 MG capsule Take 1 capsule (200 mg total) by mouth 3 (three) times daily as needed. 20 capsule Radford Pax, NP      PDMP not reviewed this encounter.   Radford Pax, NP 09/10/23 [phone]

## 2023-09-10 NOTE — Discharge Instructions (Signed)
 Please treat your symptoms with over the counter tylenol or ibuprofen, humidifier, and rest.  Tessalon as needed for cough.  Viral illnesses can last 7-14 days. Please follow up with your PCP if your symptoms are not improving. Please go to the ER for any worsening symptoms. This includes but is not limited to fever you can not control with tylenol or ibuprofen, you are not able to stay hydrated, you have shortness of breath or chest pain.  Thank you for choosing [facility] for your healthcare needs. I hope you feel better soon!

## 2023-09-10 NOTE — ED Triage Notes (Signed)
 Patient presents to UC for fever, congestion, bilateral ear pain, body aches since yesterday. Treating with benadryl.

## 2023-11-25 ENCOUNTER — Encounter: Payer: Self-pay | Admitting: Certified Nurse Midwife

## 2023-11-26 ENCOUNTER — Ambulatory Visit: Admitting: Certified Nurse Midwife

## 2024-01-11 ENCOUNTER — Other Ambulatory Visit: Payer: Self-pay

## 2024-01-11 ENCOUNTER — Encounter: Payer: Self-pay | Admitting: Obstetrics and Gynecology

## 2024-01-11 ENCOUNTER — Other Ambulatory Visit (HOSPITAL_COMMUNITY)
Admission: RE | Admit: 2024-01-11 | Discharge: 2024-01-11 | Disposition: A | Discharge status: Home / Self Care | Source: Ambulatory Visit | Attending: Obstetrics and Gynecology | Admitting: Obstetrics and Gynecology

## 2024-01-11 ENCOUNTER — Ambulatory Visit (INDEPENDENT_AMBULATORY_CARE_PROVIDER_SITE_OTHER): Admitting: Obstetrics and Gynecology

## 2024-01-11 VITALS — BP 132/93 | HR 85 | Ht 62.0 in | Wt 231.0 lb

## 2024-01-11 DIAGNOSIS — Z01419 Encounter for gynecological examination (general) (routine) without abnormal findings: Secondary | ICD-10-CM

## 2024-01-11 DIAGNOSIS — N939 Abnormal uterine and vaginal bleeding, unspecified: Secondary | ICD-10-CM | POA: Diagnosis not present

## 2024-01-11 DIAGNOSIS — Z124 Encounter for screening for malignant neoplasm of cervix: Secondary | ICD-10-CM

## 2024-01-11 DIAGNOSIS — R030 Elevated blood-pressure reading, without diagnosis of hypertension: Secondary | ICD-10-CM

## 2024-01-11 DIAGNOSIS — Z113 Encounter for screening for infections with a predominantly sexual mode of transmission: Secondary | ICD-10-CM

## 2024-01-11 DIAGNOSIS — Z1331 Encounter for screening for depression: Secondary | ICD-10-CM | POA: Diagnosis not present

## 2024-01-11 DIAGNOSIS — Z131 Encounter for screening for diabetes mellitus: Secondary | ICD-10-CM

## 2024-01-11 NOTE — Progress Notes (Signed)
 GYNECOLOGY ANNUAL PREVENTATIVE CARE ENCOUNTER NOTE  Subjective:   Kristine Johnson is a 38 y.o. G11P1011 female here for a annual gynecologic exam. Current complaints: heavy periods, swelling in extremities, nausea interfering with work occasionally, fatigue, clots.    Denies abnormal vaginal bleeding, discharge, pelvic pain, problems with intercourse or other gynecologic concerns. Requests STI screen.   Gynecologic History Patient's last menstrual period was 12/23/2023 (exact date). Contraception: condoms Last Pap: 2023. Results: LSIL Last mammogram: n/a  Obstetric History OB History  Gravida Para Term Preterm AB Living  2 1 1  1 1   SAB IAB Ectopic Multiple Live Births  1   0 1    # Outcome Date GA Lbr Len/2nd Weight Sex Type Anes PTL Lv  2 Term 02/16/23 [redacted]w[redacted]d  6 lb 10.5 oz (3.02 kg) F CS-LTranv EPI  LIV  1 SAB             Past Medical History:  Diagnosis Date   Asthma    Dysmenorrhea 11/04/2020   Fibroid    Gestational diabetes    Hypertension    Migraine    Prediabetes 08/14/2022   Needs early 2 hour   Vaginal Pap smear, abnormal     Past Surgical History:  Procedure Laterality Date   CESAREAN SECTION N/A 02/16/2023   Procedure: CESAREAN SECTION;  Surgeon: Barbra Lang PARAS, DO;  Location: [facility] LD [facility]S;  Service: Obstetrics;  Laterality: N/A;   HERNIA REPAIR  12/1996   TONSILLECTOMY  2014   WISDOM TOOTH EXTRACTION      Current Outpatient Medications on File Prior to Visit  Medication Sig Dispense Refill   acetaminophen  (TYLENOL ) 325 MG tablet Take 650 mg by mouth every 6 (six) hours as needed. As needed     albuterol  (VENTOLIN  HFA) 108 (90 Base) MCG/ACT inhaler Inhale 1-2 puffs into the lungs every 6 (six) hours as needed for shortness of breath or wheezing. (Patient not taking: Reported on 01/11/2024) 18 g 0   benzonatate  (TESSALON ) 200 MG capsule Take 1 capsule (200 mg total) by mouth 3 (three) times daily as needed. (Patient not taking: Reported on  01/11/2024) 20 capsule 0   NIFEdipine  (PROCARDIA  XL) 30 MG 24 hr tablet Take 1 tablet (30 mg total) by mouth daily. (Patient not taking: Reported on 01/11/2024) 30 tablet 0   Prenatal 27-1 MG TABS Take 1 tablet by mouth daily. (Patient not taking: Reported on 01/11/2024) 30 tablet 11   No current facility-administered medications on file prior to visit.    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, Secondary school teacher at [employer]  Tobacco Use   Smoking status: Never   Smokeless tobacco: Never  Vaping Use   Vaping status: Never Used  Substance and Sexual Activity   Alcohol use: Not Currently    Comment: socially    Drug use: Not Currently   Sexual activity: Yes    Birth control/protection: None    Comment: Female Partner  Other Topics Concern   Not on file  Social History Narrative   Not on file   Social Drivers of Corporate investment banker  Strain: Medium Risk (01/01/2023)   Overall Financial Resource Strain (CARDIA)    Difficulty of Paying Living Expenses: Somewhat hard  Food Insecurity: No Food Insecurity (01/11/2024)   Hunger Vital Sign    Worried About Running Out of Food in the Last Year: Never true    Ran Out of Food in the Last Year: Never true  Transportation Needs: No Transportation Needs (01/11/2024)   PRAPARE - Administrator, Civil Service (Medical): No    Lack of Transportation (Non-Medical): No  Physical Activity: Insufficiently Active (01/01/2023)   Exercise Vital Sign    Days of Exercise per Week: 2 days    Minutes of Exercise per Session: 30 min  Stress: No Stress Concern Present (01/01/2023)   [employer]  of Occupational Health - Occupational Stress Questionnaire    Feeling of Stress : Only a little  Social Connections: Moderately Isolated (01/01/2023)   Social Connection and Isolation Panel    Frequency of Communication with Friends and Family: More than three times a week    Frequency of Social Gatherings with Friends and Family: Twice a week    Attends Religious Services: 1 to 4 times per year    Active Member of [employer] or Organizations: No    Attends Engineer, structural: Not on file    Marital Status: Never married  Intimate Partner Violence: Not At Risk (02/14/2023)   Humiliation, Afraid, Rape, and Kick questionnaire    Fear of Current or Ex-Partner: No    Emotionally Abused: No    Physically Abused: No    Sexually Abused: No    Family History  Problem Relation Age of Onset   Diabetes Father     The following portions of the patient's history were reviewed and updated as appropriate: allergies, current medications, past family history, past medical history, past social history, past surgical history and problem list.  Review of Systems Pertinent items are noted in HPI.   Objective:  BP (!) 132/93   Pulse 85   Ht 5' 2 (1.575 m)   Wt 231 lb (104.8 kg)   LMP 12/23/2023 (Exact Date)   Breastfeeding No   BMI 42.25 kg/m  CONSTITUTIONAL: Well-developed, well-nourished female in no acute distress.  HENT:  Normocephalic, atraumatic, External right and left ear normal. Oropharynx is clear and moist EYES: Conjunctivae and EOM are normal. Pupils are equal, round, and reactive to light. No scleral icterus.  NECK: Normal range of motion, supple, no masses.  Normal thyroid.  SKIN: Skin is warm and dry. No rash noted. Not diaphoretic. No erythema. No pallor. NEUROLOGIC: Alert and oriented to person, place, and time. Normal reflexes, muscle tone coordination. No cranial nerve deficit noted. PSYCHIATRIC: Normal mood and affect. Normal behavior. Normal judgment and thought  content. CARDIOVASCULAR: Normal heart rate noted RESPIRATORY: Effort normal, no problems with respiration noted. BREASTS: Symmetric in size. No masses, skin changes, nipple drainage, or lymphadenopathy. ABDOMEN: Soft, no distention noted.  No tenderness, rebound or guarding.  PELVIC: Normal appearing external genitalia; normal appearing vaginal mucosa and cervix. No abnormal discharge noted. Cervix located very anteriorly. Pap smear obtained. Pelvic cultures obtained. Mildly enlarged uterine size, no other palpable masses, no uterine or adnexal tenderness. MUSCULOSKELETAL: Normal range of motion. No tenderness.  No cyanosis, clubbing, or edema.   Exam done with chaperone present.  Assessment and Plan:   1. Screen for STD (sexually transmitted disease) - Cytology - PAP( [place]) - RPR+HBsAg+HCVAb+...  2. Papanicolaou smear for cervical cancer  screening - Cytology - PAP( [place])  3. Well woman exam (Primary) Healthy female exam  4. Elevated blood pressure reading S/p HTN in pregnancy, stopped meds 2 months after delivery on her own -likely cHTN - will have return 2-3 weeks for follow up BP and likely start anti-hypertensive prn - she will get in with PCP for ongoing management  5. Abnormal uterine bleeding (AUB) - significant symptoms (bleeding & others) - does not want hormonal management - using condoms, does not want to be pregnant - discussed use of TXA, which is non-hormonal and would not help with mood/fatigue symptoms but may help with bleeding, which she is agreeable to - will get BP under control before starting  6. Diabetes mellitus screening - HgB A1c  Will follow up results of pap smear/STI screen and manage accordingly. Encouraged improvement in diet and exercise.   Routine preventative health maintenance measures emphasized. Please refer to After Visit Summary for other counseling recommendations.     LOIS Yolanda Moats, MD, [facility] Attending Center for  [employer] [facility])

## 2024-01-12 ENCOUNTER — Ambulatory Visit: Payer: Self-pay | Admitting: Family Medicine

## 2024-01-12 LAB — RPR+HBSAG+HCVAB+...
HIV Screen 4th Generation wRfx: NONREACTIVE
Hep C Virus Ab: NONREACTIVE
Hepatitis B Surface Ag: NEGATIVE
RPR Ser Ql: NONREACTIVE

## 2024-01-12 LAB — HEMOGLOBIN A1C
Est. average glucose Bld gHb Est-mCnc: 128 mg/dL
Hgb A1c MFr Bld: 6.1 % — ABNORMAL HIGH (ref 4.8–5.6)

## 2024-01-14 LAB — CYTOLOGY - PAP
Chlamydia: NEGATIVE
Comment: NEGATIVE
Comment: NEGATIVE
Comment: NEGATIVE
Comment: NEGATIVE
Comment: NEGATIVE
Comment: NORMAL
HPV 16: NEGATIVE
HPV 18 / 45: NEGATIVE
High risk HPV: POSITIVE — AB
Neisseria Gonorrhea: NEGATIVE
Trichomonas: NEGATIVE

## 2024-01-17 NOTE — Progress Notes (Signed)
 MyChart message sent with scheduled colpo appt.   River Mckercher,RN

## 2024-01-25 ENCOUNTER — Encounter: Payer: Self-pay | Admitting: Family Medicine

## 2024-01-25 ENCOUNTER — Other Ambulatory Visit: Payer: Self-pay

## 2024-01-25 ENCOUNTER — Ambulatory Visit: Admitting: *Deleted

## 2024-01-25 VITALS — BP 140/94 | HR 83 | Ht 62.0 in | Wt 226.1 lb

## 2024-01-25 DIAGNOSIS — Z0130 Encounter for examination of blood pressure without abnormal findings: Secondary | ICD-10-CM

## 2024-01-25 MED ORDER — NIFEDIPINE ER OSMOTIC RELEASE 30 MG PO TB24: 30.0000 mg | ORAL_TABLET | Freq: Every day | ORAL | 3 refills | Status: AC

## 2024-01-25 NOTE — Progress Notes (Signed)
 Pt presents for BP check following elevated reading on 01/11/24 during visit with Dr. Nicholaus for Annual Gyn exam. She has not been able to schedule appointment with her PCP as previously advised by Dr. Nicholaus due to outstanding financial issue. Pt reports that she has been checking BP daily at home and it is consistently elevated - 140's/90's.  She has been having some H/A's and today has mild chest pain as well.  BP - 140/94, P - 83.  Per consult with Dr. Nicholaus, pt was advised to begin taking Procardia  30 mg once daily - Rx e-prescribed. She will need BP re-check in one month - preferably @ PCP office however can be seen in this office if appt w/PCP is not possible. . Pt was instructed to continue checking BP daily. She was advised to seek immediate medical care at Urgent Care or ED if chest pain becomes worse or increase in BP values. Pt voiced understanding.

## 2024-01-27 ENCOUNTER — Encounter (HOSPITAL_COMMUNITY): Payer: Self-pay

## 2024-01-27 ENCOUNTER — Emergency Department (HOSPITAL_COMMUNITY)

## 2024-01-27 ENCOUNTER — Other Ambulatory Visit: Payer: Self-pay

## 2024-01-27 ENCOUNTER — Emergency Department (HOSPITAL_COMMUNITY)
Admission: EM | Admit: 2024-01-27 | Discharge: 2024-01-27 | Disposition: A | Discharge status: Home / Self Care | Attending: Emergency Medicine | Admitting: Emergency Medicine

## 2024-01-27 DIAGNOSIS — R0789 Other chest pain: Secondary | ICD-10-CM | POA: Insufficient documentation

## 2024-01-27 DIAGNOSIS — J45909 Unspecified asthma, uncomplicated: Secondary | ICD-10-CM | POA: Diagnosis not present

## 2024-01-27 DIAGNOSIS — I10 Essential (primary) hypertension: Secondary | ICD-10-CM | POA: Insufficient documentation

## 2024-01-27 DIAGNOSIS — R030 Elevated blood-pressure reading, without diagnosis of hypertension: Secondary | ICD-10-CM

## 2024-01-27 DIAGNOSIS — R079 Chest pain, unspecified: Secondary | ICD-10-CM | POA: Diagnosis present

## 2024-01-27 LAB — CBC
HCT: 36.8 % (ref 36.0–46.0)
Hemoglobin: 10.7 g/dL — ABNORMAL LOW (ref 12.0–15.0)
MCH: 23.6 pg — ABNORMAL LOW (ref 26.0–34.0)
MCHC: 29.1 g/dL — ABNORMAL LOW (ref 30.0–36.0)
MCV: 81.2 fL (ref 80.0–100.0)
Platelets: 384 K/uL (ref 150–400)
RBC: 4.53 MIL/uL (ref 3.87–5.11)
RDW: 15.8 % — ABNORMAL HIGH (ref 11.5–15.5)
WBC: 7.4 K/uL (ref 4.0–10.5)
nRBC: 0 % (ref 0.0–0.2)

## 2024-01-27 LAB — BASIC METABOLIC PANEL WITH GFR
Anion gap: 8 (ref 5–15)
BUN: 16 mg/dL (ref 6–20)
CO2: 25 mmol/L (ref 22–32)
Calcium: 9.1 mg/dL (ref 8.9–10.3)
Chloride: 105 mmol/L (ref 98–111)
Creatinine, Ser: 0.81 mg/dL (ref 0.44–1.00)
GFR, Estimated: >60 mL/min (ref >60)
Glucose, Bld: 104 mg/dL — ABNORMAL HIGH (ref 70–99)
Potassium: 4.1 mmol/L (ref 3.5–5.1)
Sodium: 138 mmol/L (ref 135–145)

## 2024-01-27 LAB — TROPONIN I (HIGH SENSITIVITY): Troponin I (High Sensitivity): 5 ng/L (ref <18)

## 2024-01-27 LAB — HCG, QUANTITATIVE, PREGNANCY: hCG, Beta Chain, Quant, S: <1 m[IU]/mL (ref <5)

## 2024-01-27 MED ORDER — DIPHENHYDRAMINE HCL 50 MG/ML IJ SOLN
12.5000 mg | Freq: Once | INTRAMUSCULAR | Status: AC
  Administered 2024-01-27: 12.5 mg via INTRAVENOUS
  Filled 2024-01-27: qty 1

## 2024-01-27 MED ORDER — PROCHLORPERAZINE EDISYLATE 10 MG/2ML IJ SOLN
10.0000 mg | Freq: Once | INTRAMUSCULAR | Status: AC
  Administered 2024-01-27: 10 mg via INTRAVENOUS
  Filled 2024-01-27: qty 2

## 2024-01-27 MED ORDER — ESCITALOPRAM OXALATE 10 MG PO TABS
10.0000 mg | ORAL_TABLET | Freq: Every day | ORAL | 0 refills | Status: AC
Start: 2024-01-27 — End: ?

## 2024-01-27 MED ORDER — SODIUM CHLORIDE 0.9 % IV BOLUS
1000.0000 mL | Freq: Once | INTRAVENOUS | Status: AC
  Administered 2024-01-27: 1000 mL via INTRAVENOUS

## 2024-01-27 MED ORDER — KETOROLAC TROMETHAMINE 15 MG/ML IJ SOLN
15.0000 mg | Freq: Once | INTRAMUSCULAR | Status: AC
  Administered 2024-01-27: 15 mg via INTRAVENOUS
  Filled 2024-01-27: qty 1

## 2024-01-27 NOTE — Discharge Instructions (Addendum)
 Reassuring workup.  No concerning cause of your chest pain.  I have represcribed your Lexapro .  Continue taking blood pressure medicine.  Continue keeping a blood pressure diary.  Return for any concerning symptoms. Please follow-up with your primary care doctor.

## 2024-01-27 NOTE — ED Triage Notes (Signed)
 Patient has had left sided chest pain since Sunday. No radiation. Feels nauseous and short of breath. Has a migraine also.

## 2024-01-27 NOTE — ED Provider Notes (Signed)
 [place] EMERGENCY DEPARTMENT AT [facility] Provider Note   CSN: [id number] Arrival date & time: 01/27/24  1711     Patient presents with: Chest Pain   Kristine Johnson is a 38 y.o. female.   38 year old female presents today for concern of left-sided chest pain.  This has been ongoing since Sunday.  No radiation.  Endorses some nausea and headache.  She does have history of migraine headaches.  States that this headache feels similar to her previous migraine.  She used to take Topamax but does not currently take this.  She also has history of anxiety and currently is off her Prozac.  Endorses some life stress.  Denies any shortness of breath.  No significant family history of heart disease.  The history is provided by the patient. No language interpreter was used.       Prior to Admission medications   Medication Sig Start Date End Date Taking? Authorizing Provider  acetaminophen  (TYLENOL ) 325 MG tablet Take 650 mg by mouth every 6 (six) hours as needed. As needed    [provider]  albuterol  (VENTOLIN  HFA) 108 (90 Base) MCG/ACT inhaler Inhale 1-2 puffs into the lungs every 6 (six) hours as needed for shortness of breath or wheezing. 04/23/21   Vivienne Delon HERO, PA-C  NIFEdipine  (PROCARDIA  XL) 30 MG 24 hr tablet Take 1 tablet (30 mg total) by mouth daily. Patient not taking: Reported on 01/25/2024 03/03/23   Kumar, Agnijita, MD  NIFEdipine  (PROCARDIA -XL/NIFEDICAL-XL) 30 MG 24 hr tablet Take 1 tablet (30 mg total) by mouth daily. 01/25/24   Nicholaus Burnard HERO, MD    Allergies: Covid-19 ad26 vaccine(janssen)    Review of Systems  Constitutional:  Negative for chills and fever.  Cardiovascular:  Positive for chest pain.  Neurological:  Positive for headaches.  All other systems reviewed and are negative.   Updated Vital Signs BP (!) 143/99   Pulse 89   Temp 98.2 F (36.8 C) (Oral)   Resp 19   Ht 5' 2 (1.575 m)   Wt 102.5 kg   LMP 01/20/2024  (Exact Date)   SpO2 100%   BMI 41.34 kg/m   Physical Exam Vitals and nursing note reviewed.  Constitutional:      General: She is not in acute distress.    Appearance: Normal appearance. She is not ill-appearing.  HENT:     Head: Normocephalic and atraumatic.     Nose: Nose normal.  Eyes:     Conjunctiva/sclera: Conjunctivae normal.  Cardiovascular:     Rate and Rhythm: Normal rate and regular rhythm.  Pulmonary:     Effort: Pulmonary effort is normal. No respiratory distress.  Abdominal:     General: There is no distension.     Palpations: Abdomen is soft.     Tenderness: There is no abdominal tenderness. There is no guarding.  Musculoskeletal:        General: No deformity. Normal range of motion.     Cervical back: Normal range of motion.  Skin:    Findings: No rash.  Neurological:     Mental Status: She is alert.     (all labs ordered are listed, but only abnormal results are displayed) Labs Reviewed  BASIC METABOLIC PANEL WITH GFR - Abnormal; Notable for the following components:      Result Value   Glucose, Bld 104 (*)    All other components within normal limits  CBC - Abnormal; Notable for the following components:  Hemoglobin 10.7 (*)    MCH 23.6 (*)    MCHC 29.1 (*)    RDW 15.8 (*)    All other components within normal limits  HCG, QUANTITATIVE, PREGNANCY  TROPONIN I (HIGH SENSITIVITY)  TROPONIN I (HIGH SENSITIVITY)    EKG: None  Radiology: DG Chest 2 View Result Date: 01/27/2024 EXAM: 2 VIEW(S) XRAY OF THE CHEST 01/27/2024 05:41:18 PM COMPARISON: Chest radiograph dated 02/21/2021. CLINICAL HISTORY: Chest pain. Patient has had left sided chest pain since Sunday. No radiation. Feels nauseous and short of breath. Has a migraine also. History of hypertension. FINDINGS: LUNGS AND PLEURA: No focal pulmonary opacity. No pulmonary edema. No pleural effusion. No pneumothorax. HEART AND MEDIASTINUM: No acute abnormality of the cardiac and mediastinal  silhouettes. BONES AND SOFT TISSUES: No acute osseous abnormality. IMPRESSION: 1. No acute cardiopulmonary pathology. Electronically signed by: Limin Xu MD 01/27/2024 06:10 PM EDT RP Workstation: [id number]     Procedures   Medications Ordered in the ED  ketorolac  (TORADOL ) 15 MG/ML injection 15 mg (15 mg Intravenous Given 01/27/24 1845)  diphenhydrAMINE  (BENADRYL ) injection 12.5 mg (12.5 mg Intravenous Given 01/27/24 1846)  prochlorperazine  (COMPAZINE ) injection 10 mg (10 mg Intravenous Given 01/27/24 1845)  sodium chloride  0.9 % bolus 1,000 mL (1,000 mLs Intravenous New Bag/Given 01/27/24 1845)                                    Medical Decision Making Amount and/or Complexity of Data Reviewed Labs: ordered. Radiology: ordered.  Risk Prescription drug management.   Medical Decision Making / ED Course   This patient presents to the ED for concern of chest pain, elevated blood pressure, this involves an extensive number of treatment options, and is a complaint that carries with it a high risk of complications and morbidity.  The differential diagnosis includes ACS, PE, pneumonia, MSK etiology  MDM: 38 year old female presents today for concern of left-sided chest pain.  No prior history of CAD.  No significant family history of CAD.  Ongoing since Sunday.  No change in the worsening and without radiation. History of migraines with similar migraine headache. Migraine cocktail given. ACS workup initiated. Request for restarting Lexapro .  Low heart score.  Troponin negative.  CBC without leukocytosis.  Hemoglobin around baseline.  BMP without acute concern.  Negative pregnancy test.  Chest x-ray without acute cardiopulmonary process.  EKG without acute ischemic change.  Low risk for PE on Wells criteria.  No risk factors.  Discharged in stable condition.  Return precaution discussed.  Patient voices understanding and is in agreement with plan.  BP improved at discharge.  She will  follow-up with her PCP.  Lab Tests: -I ordered, reviewed, and interpreted labs.   The pertinent results include:   Labs Reviewed  BASIC METABOLIC PANEL WITH GFR - Abnormal; Notable for the following components:      Result Value   Glucose, Bld 104 (*)    All other components within normal limits  CBC - Abnormal; Notable for the following components:   Hemoglobin 10.7 (*)    MCH 23.6 (*)    MCHC 29.1 (*)    RDW 15.8 (*)    All other components within normal limits  HCG, QUANTITATIVE, PREGNANCY  TROPONIN I (HIGH SENSITIVITY)  TROPONIN I (HIGH SENSITIVITY)      EKG  EKG Interpretation Date/Time:    Ventricular Rate:    PR Interval:  QRS Duration:    QT Interval:    QTC Calculation:   R Axis:      Text Interpretation:           Imaging Studies ordered: I ordered imaging studies including chest x-ray I independently visualized and interpreted imaging. I agree with the radiologist interpretation   Medicines ordered and prescription drug management: Meds ordered this encounter  Medications   ketorolac  (TORADOL ) 15 MG/ML injection 15 mg   diphenhydrAMINE  (BENADRYL ) injection 12.5 mg   prochlorperazine  (COMPAZINE ) injection 10 mg   sodium chloride  0.9 % bolus 1,000 mL    -I have reviewed the patients home medicines and have made adjustments as needed   Reevaluation: After the interventions noted above, I reevaluated the patient and found that they have :improved  Co morbidities that complicate the patient evaluation  Past Medical History:  Diagnosis Date   Asthma    Dysmenorrhea 11/04/2020   Fibroid    Gestational diabetes    Hypertension    Migraine    Prediabetes 08/14/2022   Needs early 2 hour   Vaginal Pap smear, abnormal       Dispostion: Discharged in stable condition.  Headache improved.    Final diagnoses:  Atypical chest pain  Elevated blood pressure reading    ED Discharge Orders          Ordered    Ambulatory referral to  Cardiology       Comments: If you have not heard from the Cardiology office within the next 72 hours please call [phone].   01/27/24 2031    escitalopram  (LEXAPRO ) 10 MG tablet  Daily        01/27/24 2031               Hildegard Loge, PA-C 01/27/24 2123    Elnor Jayson LABOR, DO 01/27/24 2334

## 2024-01-31 NOTE — Progress Notes (Deleted)
    OFFICE NOTE:    Date:  01/31/2024  ID:  Kristine Johnson, DOB 04-17-1986, MRN [id number] PCP: Claudene Round, MD  [facility] Providers Cardiologist:  None { Click to update primary MD,subspecialty MD or APP then REFRESH:1}       HTN in pregnancy Gestational diabetes         Discussed the use of AI scribe software for clinical note transcription with the patient, who gave verbal consent to proceed. History of Present Illness Kristine Johnson is a 38 y.o. female who is referred by Hildegard Loge, PA-C for chest pain. She went to the ED on 01/27/24 with prolonged chest pain. Hgb was low at 10.7. Creatinine, K+ were normal. EKG was personally reviewed and interpreted by me today (***) and demonstrated NSR, no acute STTW changes. CXR was neg.     ROS-See HPI***    Studies Reviewed:      *** Results  Risk Assessment/Calculations: {Does this patient have ATRIAL FIBRILLATION?:[phone]} No BP recorded.  {Refresh Note OR Click here to enter BP  :1}***      Physical Exam:  VS:  LMP 01/20/2024 (Exact Date)        Wt Readings from Last 3 Encounters:  01/27/24 226 lb (102.5 kg)  01/25/24 226 lb 1.6 oz (102.6 kg)  01/11/24 231 lb (104.8 kg)    Physical Exam***     Assessment and Plan:    Assessment & Plan Precordial chest pain  Assessment and Plan Assessment & Plan    {      :1}    {Are you ordering a CV Procedure (e.g. stress test, cath, DCCV, TEE, etc)?   Press F2        :[id number]}  Dispo:  No follow-ups on file.  Signed, Glendia Ferrier, PA-C

## 2024-02-01 ENCOUNTER — Ambulatory Visit: Admitting: Physician Assistant

## 2024-02-01 DIAGNOSIS — R072 Precordial pain: Secondary | ICD-10-CM

## 2024-02-11 ENCOUNTER — Telehealth: Admitting: Physician Assistant

## 2024-02-11 DIAGNOSIS — J069 Acute upper respiratory infection, unspecified: Secondary | ICD-10-CM

## 2024-02-11 MED ORDER — BENZONATATE 100 MG PO CAPS: 100.0000 mg | ORAL_CAPSULE | Freq: Three times a day (TID) | ORAL | 0 refills | Status: AC

## 2024-02-11 MED ORDER — ALBUTEROL SULFATE HFA 108 (90 BASE) MCG/ACT IN AERS: 2.0000 | INHALATION_SPRAY | Freq: Four times a day (QID) | RESPIRATORY_TRACT | 0 refills | Status: AC | PRN

## 2024-02-11 NOTE — Progress Notes (Signed)

## 2024-02-14 ENCOUNTER — Ambulatory Visit: Admitting: Nurse Practitioner

## 2024-02-29 ENCOUNTER — Ambulatory Visit

## 2024-03-01 ENCOUNTER — Ambulatory Visit: Attending: Physician Assistant | Admitting: Physician Assistant

## 2024-03-07 ENCOUNTER — Encounter: Payer: Self-pay | Admitting: Obstetrics and Gynecology

## 2024-03-07 ENCOUNTER — Other Ambulatory Visit (HOSPITAL_COMMUNITY)
Admission: RE | Admit: 2024-03-07 | Discharge: 2024-03-07 | Disposition: A | Discharge status: Home / Self Care | Source: Ambulatory Visit | Attending: Obstetrics and Gynecology | Admitting: Obstetrics and Gynecology

## 2024-03-07 ENCOUNTER — Encounter: Payer: Self-pay | Admitting: Family Medicine

## 2024-03-07 ENCOUNTER — Other Ambulatory Visit: Payer: Self-pay

## 2024-03-07 ENCOUNTER — Ambulatory Visit (INDEPENDENT_AMBULATORY_CARE_PROVIDER_SITE_OTHER): Admitting: Obstetrics and Gynecology

## 2024-03-07 VITALS — BP 144/85 | HR 89 | Wt 224.5 lb

## 2024-03-07 DIAGNOSIS — R87612 Low grade squamous intraepithelial lesion on cytologic smear of cervix (LGSIL): Secondary | ICD-10-CM

## 2024-03-07 DIAGNOSIS — Z1331 Encounter for screening for depression: Secondary | ICD-10-CM

## 2024-03-07 DIAGNOSIS — Z3202 Encounter for pregnancy test, result negative: Secondary | ICD-10-CM

## 2024-03-07 LAB — POCT PREGNANCY, URINE: Preg Test, Ur: NEGATIVE

## 2024-03-07 NOTE — Progress Notes (Signed)
 Colposcopy Procedure Note  Gregoria Theora Vankirk is a 38 y.o. G2P1011 here for colposcopy.  Indications:  Pap 12/2023: LSIL, positive high risk HPV, neg 16/18/45 Pap 10/2021: LSIL, no HPV testing done Pap 10/2020: LSIL, positive high risk HPV, neg 16/18/45  Procedure Details  LMP unknown; UPT negative.    The risks (including infection, bleeding, pain) and benefits of the procedure were explained to the patient and written informed consent was obtained.  The patient was placed in the dorsal lithotomy position. A Graves was speculum inserted in the vagina, and the cervix was visualized.  The cervix was stained with acetic acid and visualized using the colposcope under magnification as well as with a green filter. Findings as below. Cervical biopsies were taken at 2, 6, 10 o'clock. Endocervical curettage then performed in all four quadrants. Small amount of bleeding noted that improved with pressure. Monsel's solution applied with good hemostasis noted. Patient tolerating procedure well.  Findings: acetowhite at 2, 6 and 10 o'clock, increased vascularity at 4 o'clock  Impression: low grade  Adequate: yes  Specimens:  Cervical biopsy at 2, 6, 10 o'clock Endocervical curettage  Condition: Stable  Complications: None  Plan: The patient was advised to call for any fever or for prolonged or severe pain or bleeding. She was advised to use OTC analgesics as needed for mild to moderate pain. She was advised to avoid vaginal intercourse for 48 hours or until the bleeding has completely stopped.  Will base further management on results of biopsy.   LOIS Yolanda Moats, MD, [facility] Attending Center for [employer] [facility])

## 2024-03-09 LAB — SURGICAL PATHOLOGY

## 2024-03-13 ENCOUNTER — Ambulatory Visit: Payer: Self-pay | Admitting: Obstetrics and Gynecology

## 2024-04-10 ENCOUNTER — Telehealth: Admitting: Physician Assistant

## 2024-04-10 DIAGNOSIS — B3731 Acute candidiasis of vulva and vagina: Secondary | ICD-10-CM

## 2024-04-10 MED ORDER — FLUCONAZOLE 150 MG PO TABS: 150.0000 mg | ORAL_TABLET | Freq: Once | ORAL | 0 refills | Status: AC

## 2024-04-10 NOTE — Progress Notes (Signed)
